# Patient Record
Sex: Female | Born: 1960 | Race: White | Hispanic: No | Marital: Married | Smoking: Never smoker
Health system: Southern US, Community
[De-identification: ages and names within clinical notes are randomized; demographics above are authoritative.]

## PROBLEM LIST (undated history)

## (undated) DIAGNOSIS — E785 Hyperlipidemia, unspecified: Secondary | ICD-10-CM

## (undated) DIAGNOSIS — K8020 Calculus of gallbladder without cholecystitis without obstruction: Secondary | ICD-10-CM

## (undated) DIAGNOSIS — G43909 Migraine, unspecified, not intractable, without status migrainosus: Secondary | ICD-10-CM

## (undated) HISTORY — DX: Hyperlipidemia, unspecified: E78.5

## (undated) HISTORY — PX: BREAST BIOPSY: SHX20

## (undated) HISTORY — PX: ECTOPIC PREGNANCY SURGERY: SHX613

## (undated) HISTORY — DX: Calculus of gallbladder without cholecystitis without obstruction: K80.20

## (undated) HISTORY — DX: Migraine, unspecified, not intractable, without status migrainosus: G43.909

---

## 2004-05-15 ENCOUNTER — Ambulatory Visit: Payer: Self-pay | Admitting: Internal Medicine

## 2005-09-21 ENCOUNTER — Ambulatory Visit: Payer: Self-pay | Admitting: Internal Medicine

## 2005-09-24 ENCOUNTER — Ambulatory Visit: Payer: Self-pay | Admitting: Internal Medicine

## 2008-01-05 ENCOUNTER — Ambulatory Visit: Payer: Self-pay | Admitting: Internal Medicine

## 2009-01-09 ENCOUNTER — Ambulatory Visit: Payer: Self-pay | Admitting: Internal Medicine

## 2010-01-14 ENCOUNTER — Ambulatory Visit: Payer: Self-pay | Admitting: Internal Medicine

## 2011-01-20 ENCOUNTER — Ambulatory Visit: Payer: Self-pay | Admitting: Internal Medicine

## 2012-01-26 ENCOUNTER — Ambulatory Visit: Payer: Self-pay | Admitting: Internal Medicine

## 2012-12-30 ENCOUNTER — Ambulatory Visit: Payer: Self-pay | Admitting: Gastroenterology

## 2013-01-31 ENCOUNTER — Ambulatory Visit: Payer: Self-pay | Admitting: Internal Medicine

## 2014-02-01 ENCOUNTER — Ambulatory Visit: Payer: Self-pay | Admitting: Internal Medicine

## 2014-09-11 ENCOUNTER — Ambulatory Visit
Admit: 2014-09-11 | Disposition: A | Discharge status: Home / Self Care | Payer: Self-pay | Attending: Chiropractic Medicine | Admitting: Chiropractic Medicine

## 2014-09-27 ENCOUNTER — Ambulatory Visit
Admit: 2014-09-27 | Disposition: A | Discharge status: Home / Self Care | Payer: Self-pay | Attending: Internal Medicine | Admitting: Internal Medicine

## 2014-12-27 ENCOUNTER — Other Ambulatory Visit: Payer: Self-pay | Admitting: Internal Medicine

## 2014-12-27 DIAGNOSIS — Z1231 Encounter for screening mammogram for malignant neoplasm of breast: Secondary | ICD-10-CM

## 2015-02-04 ENCOUNTER — Ambulatory Visit
Admission: RE | Admit: 2015-02-04 | Discharge: 2015-02-04 | Disposition: A | Discharge status: Home / Self Care | Payer: PRIVATE HEALTH INSURANCE | Source: Ambulatory Visit | Attending: Internal Medicine | Admitting: Internal Medicine

## 2015-02-04 DIAGNOSIS — Z1231 Encounter for screening mammogram for malignant neoplasm of breast: Secondary | ICD-10-CM | POA: Diagnosis not present

## 2015-03-14 ENCOUNTER — Other Ambulatory Visit: Payer: Self-pay | Admitting: Physician Assistant

## 2015-03-14 ENCOUNTER — Observation Stay
Admission: EM | Admit: 2015-03-14 | Discharge: 2015-03-16 | Disposition: A | Discharge status: Home / Self Care | Payer: PRIVATE HEALTH INSURANCE | Attending: General Surgery | Admitting: General Surgery

## 2015-03-14 ENCOUNTER — Encounter
Admission: EM | Disposition: A | Discharge status: Home / Self Care | Payer: Self-pay | Source: Home / Self Care | Attending: Emergency Medicine

## 2015-03-14 ENCOUNTER — Ambulatory Visit
Admission: RE | Admit: 2015-03-14 | Discharge: 2015-03-14 | Disposition: A | Discharge status: Home / Self Care | Payer: PRIVATE HEALTH INSURANCE | Source: Ambulatory Visit | Attending: Physician Assistant | Admitting: Physician Assistant

## 2015-03-14 ENCOUNTER — Encounter: Payer: Self-pay | Admitting: *Deleted

## 2015-03-14 ENCOUNTER — Ambulatory Visit: Payer: Self-pay | Admitting: Physician Assistant

## 2015-03-14 VITALS — BP 130/80 | HR 97 | Temp 98.3°F

## 2015-03-14 DIAGNOSIS — Z885 Allergy status to narcotic agent status: Secondary | ICD-10-CM | POA: Insufficient documentation

## 2015-03-14 DIAGNOSIS — Z791 Long term (current) use of non-steroidal anti-inflammatories (NSAID): Secondary | ICD-10-CM | POA: Insufficient documentation

## 2015-03-14 DIAGNOSIS — Z9889 Other specified postprocedural states: Secondary | ICD-10-CM | POA: Insufficient documentation

## 2015-03-14 DIAGNOSIS — K5730 Diverticulosis of large intestine without perforation or abscess without bleeding: Secondary | ICD-10-CM | POA: Insufficient documentation

## 2015-03-14 DIAGNOSIS — K8010 Calculus of gallbladder with chronic cholecystitis without obstruction: Secondary | ICD-10-CM

## 2015-03-14 DIAGNOSIS — K819 Cholecystitis, unspecified: Secondary | ICD-10-CM | POA: Diagnosis present

## 2015-03-14 DIAGNOSIS — R109 Unspecified abdominal pain: Secondary | ICD-10-CM

## 2015-03-14 DIAGNOSIS — E785 Hyperlipidemia, unspecified: Secondary | ICD-10-CM | POA: Insufficient documentation

## 2015-03-14 DIAGNOSIS — Z91041 Radiographic dye allergy status: Secondary | ICD-10-CM | POA: Insufficient documentation

## 2015-03-14 DIAGNOSIS — K8012 Calculus of gallbladder with acute and chronic cholecystitis without obstruction: Principal | ICD-10-CM | POA: Insufficient documentation

## 2015-03-14 DIAGNOSIS — K821 Hydrops of gallbladder: Secondary | ICD-10-CM | POA: Insufficient documentation

## 2015-03-14 DIAGNOSIS — Z91013 Allergy to seafood: Secondary | ICD-10-CM | POA: Insufficient documentation

## 2015-03-14 DIAGNOSIS — Z79891 Long term (current) use of opiate analgesic: Secondary | ICD-10-CM | POA: Insufficient documentation

## 2015-03-14 DIAGNOSIS — Z8669 Personal history of other diseases of the nervous system and sense organs: Secondary | ICD-10-CM | POA: Insufficient documentation

## 2015-03-14 HISTORY — PX: LAPAROSCOPIC CHOLECYSTECTOMY SINGLE PORT: SHX5891

## 2015-03-14 LAB — COMPREHENSIVE METABOLIC PANEL
ALK PHOS: 116 U/L (ref 38–126)
ALT: 78 U/L — AB (ref 14–54)
AST: 56 U/L — AB (ref 15–41)
Albumin: 4.4 g/dL (ref 3.5–5.0)
Anion gap: 10 (ref 5–15)
BILIRUBIN TOTAL: 0.6 mg/dL (ref 0.3–1.2)
BUN: 13 mg/dL (ref 6–20)
CALCIUM: 9.8 mg/dL (ref 8.9–10.3)
CHLORIDE: 102 mmol/L (ref 101–111)
CO2: 25 mmol/L (ref 22–32)
CREATININE: 0.67 mg/dL (ref 0.44–1.00)
Glucose, Bld: 120 mg/dL — ABNORMAL HIGH (ref 65–99)
Potassium: 3.9 mmol/L (ref 3.5–5.1)
Sodium: 137 mmol/L (ref 135–145)
TOTAL PROTEIN: 8.1 g/dL (ref 6.5–8.1)

## 2015-03-14 LAB — CBC WITH DIFFERENTIAL/PLATELET
Basophils Absolute: 0.1 10*3/uL (ref 0–0.1)
Basophils Relative: 0 %
EOS PCT: 0 %
Eosinophils Absolute: 0 10*3/uL (ref 0–0.7)
HEMATOCRIT: 39.5 % (ref 35.0–47.0)
Hemoglobin: 13.2 g/dL (ref 12.0–16.0)
LYMPHS ABS: 1.9 10*3/uL (ref 1.0–3.6)
LYMPHS PCT: 10 %
MCH: 30.4 pg (ref 26.0–34.0)
MCHC: 33.5 g/dL (ref 32.0–36.0)
MCV: 90.8 fL (ref 80.0–100.0)
Monocytes Absolute: 0.9 10*3/uL (ref 0.2–0.9)
Monocytes Relative: 5 %
NEUTROS ABS: 16.1 10*3/uL — AB (ref 1.4–6.5)
Neutrophils Relative %: 85 %
PLATELETS: 301 10*3/uL (ref 150–440)
RBC: 4.35 MIL/uL (ref 3.80–5.20)
RDW: 13.2 % (ref 11.5–14.5)
WBC: 19.1 10*3/uL — AB (ref 3.6–11.0)

## 2015-03-14 LAB — URINALYSIS COMPLETE WITH MICROSCOPIC (ARMC ONLY)
BILIRUBIN URINE: NEGATIVE
Bacteria, UA: NONE SEEN
GLUCOSE, UA: NEGATIVE mg/dL
Hgb urine dipstick: NEGATIVE
KETONES UR: NEGATIVE mg/dL
Nitrite: NEGATIVE
PH: 6 (ref 5.0–8.0)
Protein, ur: NEGATIVE mg/dL
Specific Gravity, Urine: 1.015 (ref 1.005–1.030)

## 2015-03-14 LAB — POCT URINALYSIS DIPSTICK
BILIRUBIN UA: NEGATIVE
GLUCOSE UA: NEGATIVE
Ketones, UA: NEGATIVE
NITRITE UA: NEGATIVE
PH UA: 7
Protein, UA: NEGATIVE
Spec Grav, UA: 1.02
Urobilinogen, UA: 0.2

## 2015-03-14 LAB — LIPASE, BLOOD: LIPASE: 30 U/L (ref 22–51)

## 2015-03-14 SURGERY — LAPAROSCOPIC CHOLECYSTECTOMY SINGLE SITE
Anesthesia: General

## 2015-03-14 MED ORDER — KETOROLAC TROMETHAMINE 60 MG/2ML IM SOLN: 60.0000 mg | Freq: Once | INTRAMUSCULAR | Status: DC

## 2015-03-14 MED ORDER — KETOROLAC TROMETHAMINE 10 MG PO TABS: 10.0000 mg | ORAL_TABLET | Freq: Four times a day (QID) | ORAL | Status: DC | PRN

## 2015-03-14 MED ORDER — DEXTROSE 5 % IV SOLN
2.0000 g | Freq: Once | INTRAVENOUS | Status: AC
  Administered 2015-03-14: 2 g via INTRAVENOUS
  Filled 2015-03-14: qty 2

## 2015-03-14 MED ORDER — SODIUM CHLORIDE 0.9 % IV BOLUS (SEPSIS)
1000.0000 mL | Freq: Once | INTRAVENOUS | Status: AC
  Administered 2015-03-14: 1000 mL via INTRAVENOUS

## 2015-03-14 SURGICAL SUPPLY — 66 items
ADH LQ OCL WTPRF AMP STRL LF (MISCELLANEOUS) ×1
ADHESIVE MASTISOL STRL (MISCELLANEOUS) ×2 IMPLANT
APPLIER CLIP ROT 10 11.4 M/L (STAPLE) ×3
APR CLP MED LRG 11.4X10 (STAPLE) ×1
BAG COUNTER SPONGE EZ (MISCELLANEOUS) ×1 IMPLANT
BAG SPNG 4X4 CLR HAZ (MISCELLANEOUS)
BLADE SURG SZ11 CARB STEEL (BLADE) ×3 IMPLANT
BULB RESERV EVAC DRAIN JP 100C (MISCELLANEOUS) ×1 IMPLANT
CANISTER SUCT 1200ML W/VALVE (MISCELLANEOUS) ×5 IMPLANT
CATH REDDICK CHOLANGI 4FR 50CM (CATHETERS) IMPLANT
CHLORAPREP W/TINT 26ML (MISCELLANEOUS) ×3 IMPLANT
CLIP APPLIE ROT 10 11.4 M/L (STAPLE) ×1 IMPLANT
CLOSURE WOUND 1/2 X4 (GAUZE/BANDAGES/DRESSINGS) ×1
CONRAY 60ML FOR OR (MISCELLANEOUS) IMPLANT
COUNTER SPONGE BAG EZ (MISCELLANEOUS)
DISSECTOR KITTNER STICK (MISCELLANEOUS) ×1 IMPLANT
DISSECTORS/KITTNER STICK (MISCELLANEOUS)
DRAIN CHANNEL JP 19F (MISCELLANEOUS) ×1 IMPLANT
DRAPE SHEET LG 3/4 BI-LAMINATE (DRAPES) ×3 IMPLANT
DRESSING SURGICEL FIBRLLR 1X2 (HEMOSTASIS) IMPLANT
DRSG SURGICEL FIBRILLAR 1X2 (HEMOSTASIS) ×3
DRSG TEGADERM 2-3/8X2-3/4 SM (GAUZE/BANDAGES/DRESSINGS) ×4 IMPLANT
DRSG TELFA 3X8 NADH (GAUZE/BANDAGES/DRESSINGS) IMPLANT
ELECT E-Z MONOPOLAR 33 (MISCELLANEOUS) ×3
ELECTRODE E-Z MONOPOLAR 33 (MISCELLANEOUS) IMPLANT
ENDOLOOP SUT PDS II  0 18 (SUTURE)
ENDOLOOP SUT PDS II 0 18 (SUTURE) ×1 IMPLANT
ENDOPOUCH RETRIEVER 10 (MISCELLANEOUS) ×3 IMPLANT
GAUZE SPONGE 4X4 12PLY STRL (GAUZE/BANDAGES/DRESSINGS) ×2 IMPLANT
GLOVE BIO SURGEON STRL SZ7.5 (GLOVE) ×9 IMPLANT
GOWN STRL REUS W/ TWL LRG LVL3 (GOWN DISPOSABLE) ×3 IMPLANT
GOWN STRL REUS W/TWL LRG LVL3 (GOWN DISPOSABLE) ×6
HEMOSTAT SURGICEL 2X3 (HEMOSTASIS) ×6 IMPLANT
IRRIGATION STRYKERFLOW (MISCELLANEOUS) ×1 IMPLANT
IRRIGATOR STRYKERFLOW (MISCELLANEOUS) ×3
IV CATH ANGIO 12GX3 LT BLUE (NEEDLE) IMPLANT
IV NS 1000ML (IV SOLUTION) ×3
IV NS 1000ML BAXH (IV SOLUTION) ×1 IMPLANT
LABEL OR SOLS (LABEL) ×1 IMPLANT
LIQUID BAND (GAUZE/BANDAGES/DRESSINGS) IMPLANT
NDL HYPO 25X1 1.5 SAFETY (NEEDLE) ×1 IMPLANT
NEEDLE HYPO 25X1 1.5 SAFETY (NEEDLE) ×3 IMPLANT
NS IRRIG 500ML POUR BTL (IV SOLUTION) ×3 IMPLANT
PACK LAP CHOLECYSTECTOMY (MISCELLANEOUS) ×3 IMPLANT
PAD DRESSING TELFA 3X8 NADH (GAUZE/BANDAGES/DRESSINGS) ×1 IMPLANT
PAD GROUND ADULT SPLIT (MISCELLANEOUS) ×3 IMPLANT
PENCIL ELECTRO HAND CTR (MISCELLANEOUS) ×2 IMPLANT
SCISSORS METZENBAUM CVD 33 (INSTRUMENTS) ×3 IMPLANT
SEAL FOR SCOPE WARMER C3101 (MISCELLANEOUS) ×1 IMPLANT
SLEEVE ENDOPATH XCEL 5M (ENDOMECHANICALS) ×5 IMPLANT
SLEEVE SCD COMPRESS THIGH MED (MISCELLANEOUS) ×2 IMPLANT
STRAP SAFETY BODY (MISCELLANEOUS) ×3 IMPLANT
STRIP CLOSURE SKIN 1/2X4 (GAUZE/BANDAGES/DRESSINGS) ×2 IMPLANT
SUT MNCRL 4-0 (SUTURE) ×6
SUT MNCRL 4-0 27XMFL (SUTURE) ×2
SUT VIC AB 2-0 SH 27 (SUTURE) ×3
SUT VIC AB 2-0 SH 27XBRD (SUTURE) IMPLANT
SUT VICRYL 0 AB UR-6 (SUTURE) ×1 IMPLANT
SUTURE MNCRL 4-0 27XMF (SUTURE) ×1 IMPLANT
SWABSTK COMLB BENZOIN TINCTURE (MISCELLANEOUS) IMPLANT
TROCAR XCEL 12X100 BLDLESS (ENDOMECHANICALS) ×2 IMPLANT
TROCAR XCEL BLUNT TIP 100MML (ENDOMECHANICALS) ×1 IMPLANT
TROCAR XCEL NON-BLD 11X100MML (ENDOMECHANICALS) ×1 IMPLANT
TROCAR XCEL NON-BLD 5MMX100MML (ENDOMECHANICALS) ×3 IMPLANT
TUBING INSUFFLATOR HI FLOW (MISCELLANEOUS) ×3 IMPLANT
WATER STERILE IRR 1000ML POUR (IV SOLUTION) ×1 IMPLANT

## 2015-03-14 NOTE — Addendum Note (Signed)
Addended by: Faythe Ghee on: 03/14/2015 05:08 PM   Modules accepted: Orders

## 2015-03-14 NOTE — H&P (Signed)
Patient ID: Jasmine [name], female   DOB: 11-16-1960, 54 y.o.   MRN: [id number]  CC: RUQ PAIN  HPI Jasmine [name] is a 54 y.o. female presents to emergency department for evaluation of a 5 day history of back pain and right upper quadrant pain. Patient states that 5 days ago she started having back pain in her right upper back that she thought was her usual back pain from her chronic problems. She states that gradually over the week she's had the pain moved to her right upper quadrant of her abdomen that she is from her front or back. The pain is made worse after eating especially fatty foods. However her abdominal pain acutely got worse today after eating [place] food for lunch. She denies any fevers, chills, nausea, vomiting, diarrhea, chest pain, shortness of breath. She's never had anything like this before in her abdomen. She was in her usual state of good health prior to the starting.  HPI  History reviewed. No pertinent past medical history.  Past Surgical History  Procedure Laterality Date  . Breast biopsy Left     neg    History reviewed. No pertinent family history.  Social History Social History  Substance Use Topics  . Smoking status: Never Smoker   . Smokeless tobacco: None  . Alcohol Use: No    Allergies  Allergen Reactions  . Codeine Nausea Only    Family history of allergy.  . Iodinated Diagnostic Agents Itching  . Shellfish Allergy Itching    Current Facility-Administered Medications  Medication Dose Route Frequency Provider Last Rate Last Dose  . cefOXitin (MEFOXIN) 2 g in dextrose 5 % 50 mL IVPB  2 g Intravenous Once Rockne Menghini, MD       Current Outpatient Prescriptions  Medication Sig Dispense Refill  . ibuprofen (ADVIL,MOTRIN) 200 MG tablet Take 400 mg by mouth every 6 (six) hours as needed for mild pain or moderate pain.     Marland Kitchen ketorolac (TORADOL) 10 MG tablet Take 1 tablet (10 mg total) by mouth every 6 (six) hours as needed. (Patient not  taking: Reported on 03/14/2015) 20 tablet 0     Review of Systems A multipoint review of systems was completed. All pertinent positives and negatives within the history of present illness the remainder were negative.  Physical Exam Blood pressure 160/95, pulse 99, temperature 99 F (37.2 C), temperature source Oral, resp. rate 18, height  (1.549 m), weight 78.019 kg (172 lb), SpO2 95 %. CONSTITUTIONAL: Resting in bed in no acute distress.Marland Kitchen EYES: Pupils are equal, round, and reactive to light, Sclera are non-icteric. EARS, NOSE, MOUTH AND THROAT: The oropharynx is clear. The oral mucosa is pink and moist. Hearing is intact to voice. LYMPH NODES:  Lymph nodes in the neck are normal. RESPIRATORY:  Lungs are clear. There is normal respiratory effort, with equal breath sounds bilaterally, and without pathologic use of accessory muscles. CARDIOVASCULAR: Heart is regular without murmurs, gallops, or rubs. GI: The abdomen is soft, tender to palpation in the right upper quadrant, and nondistended. There is no Murphy sign on my exam. No peritoneal signs There are no palpable masses. There is no hepatosplenomegaly. There are normal bowel sounds in all quadrants. GU: Rectal deferred.   MUSCULOSKELETAL: Normal muscle strength and tone. No cyanosis or edema.   SKIN: Turgor is good and there are no pathologic skin lesions or ulcers. NEUROLOGIC: Motor and sensation is grossly normal. Cranial nerves are grossly intact. PSYCH:  Oriented to person,  place and time. Affect is normal.  Data Reviewed Labs and imaging reviewed. Labs concerning for white blood cell count 19.1. No laboratory findings concerning for choledocholithiasis. CT scan reviewed. Shows a dilated gallbladder with a 2 cm stone in the gallbladder fossa with some mild pericholecystic stranding consistent with cholecystitis. I have personally reviewed the patient's imaging, laboratory findings and medical records.    Assessment   73  54 year old female with cholecystitis     Plan    Patient has were counseled as to the diagnosis of cholecystitis. Treatment options of antibiotics with a delayed cholecystectomy versus immediate laparoscopic cholecystectomy were discussed in detail. The procedure was ascribed in detail to include the risks, benefits, alternatives. Patient and her husband both voiced understanding and wished proceed and informed consent was obtained by the emergency department. We'll proceed to the operating room emergently for laparoscopic cholecystectomy. We'll admit for observation postoperatively.      Time spent with the patient was 30 minutes, with more than 50% of the time spent in face-to-face education, counseling and care coordination.     Ricarda Frame 03/14/2015, 11:09 PM

## 2015-03-14 NOTE — ED Notes (Signed)
Pt has right upper quad abd pain for 5 days.  Pt had outpt ct scan today.  Sent to er for eval of abd pain.  No n/v/d.

## 2015-03-14 NOTE — ED Provider Notes (Signed)
[facility] Emergency Department Provider Note  ____________________________________________  Time seen: Approximately 9:18 PM  I have reviewed the triage vital signs and the nursing notes.   HISTORY  Chief Complaint Abdominal Pain    HPI Jasmine [name] is a 54 y.o. female presenting for outpatient CT scan showing cholecystitis. Patient reports that for the past 5 days she has had right mid back pain that radiates to the right upper quadrant. Today she began to have a "gripping" right upper quadrant pain. She went to employee health who ordered labs including a white blood cell count of 19 and a CT scan of the abdomen showing cholecystitis. The patient denies any nausea, vomiting, fever, chills, urinary symptoms, constipation or diarrhea.   No past medical history on file.  Patient Active Problem List   Diagnosis Date Noted  . History of migraine headaches 03/14/2015  . HLD (hyperlipidemia) 03/14/2015    Past Surgical History  Procedure Laterality Date  . Breast biopsy Left     neg    Current Outpatient Rx  Name  Route  Sig  Dispense  Refill  . Cholecalciferol (VITAMIN D-1000 MAX ST) 1000 UNITS tablet   Oral   Take by mouth.         Marland Kitchen ibuprofen (ADVIL,MOTRIN) 200 MG tablet   Oral   Take by mouth.         Marland Kitchen ketorolac (TORADOL) 10 MG tablet   Oral   Take 1 tablet (10 mg total) by mouth every 6 (six) hours as needed.   20 tablet   0     Allergies Codeine; Iodinated diagnostic agents; and Shellfish allergy  No family history on file.  Social History Social History  Substance Use Topics  . Smoking status: Never Smoker   . Smokeless tobacco: Not on file  . Alcohol Use: No    Review of Systems Constitutional: No fever/chills. No syncope Eyes: No visual changes. ENT: No sore throat. Cardiovascular: Denies chest pain, palpitations. Respiratory: Denies shortness of breath.  No cough. Gastrointestinal: Positive abdominal pain.   No nausea, no vomiting.  No diarrhea.  No constipation. Genitourinary: Negative for dysuria. Musculoskeletal: Negative for back pain. Skin: Negative for rash. Neurological: Negative for headaches, focal weakness or numbness.  10-point ROS otherwise negative.  ____________________________________________   PHYSICAL EXAM:  VITAL SIGNS: ED Triage Vitals  Enc Vitals Group     BP 03/14/15 1803 163/89 mmHg     Pulse Rate 03/14/15 1803 112     Resp 03/14/15 1803 20     Temp 03/14/15 1803 99 F (37.2 C)     Temp Source 03/14/15 1803 Oral     SpO2 03/14/15 1803 99 %     Weight 03/14/15 1803 172 lb (78.019 kg)     Height 03/14/15 1803  (1.549 m)     Head Cir --      Peak Flow --      Pain Score 03/14/15 1804 6     Pain Loc --      Pain Edu? --      Excl. in GC? --     Constitutional: Alert and oriented. Well appearing and in no acute distress. Answer question appropriately. Eyes: Conjunctivae are normal.  EOMI. Head: Atraumatic. Nose: No congestion/rhinnorhea. Mouth/Throat: Mucous membranes are moist.  Neck: No stridor.  Supple.   Cardiovascular: Normal rate, regular rhythm. No murmurs, rubs or gallops.  Respiratory: Normal respiratory effort.  No retractions. Lungs CTAB.  No wheezes, rales  or ronchi. Gastrointestinal: Soft tender to palpation in the right upper quadrant with positive Murphy sign. No peritoneal signs. No distention. No peritoneal signs. Musculoskeletal: No LE edema.  Neurologic:  Normal speech and language. No gross focal neurologic deficits are appreciated.  Skin:  Skin is warm, dry and intact. No rash noted. Psychiatric: Mood and affect are normal. Speech and behavior are normal.  Normal judgement.  ____________________________________________   LABS (all labs ordered are listed, but only abnormal results are displayed)  Labs Reviewed  CBC WITH DIFFERENTIAL/PLATELET - Abnormal; Notable for the following:    WBC 19.1 (*)    Neutro Abs 16.1 (*)     All other components within normal limits  COMPREHENSIVE METABOLIC PANEL - Abnormal; Notable for the following:    Glucose, Bld 120 (*)    AST 56 (*)    ALT 78 (*)    All other components within normal limits  URINALYSIS COMPLETEWITH MICROSCOPIC ([facility] ONLY) - Abnormal; Notable for the following:    Color, Urine YELLOW (*)    APPearance CLEAR (*)    Leukocytes, UA 2+ (*)    Squamous Epithelial / LPF 0-5 (*)    All other components within normal limits  LIPASE, BLOOD   ____________________________________________  EKG  Not indicated ____________________________________________  RADIOLOGY  Ct Renal Stone Study  03/14/2015   CLINICAL DATA:  Acute right flank pain.  EXAM: CT ABDOMEN AND PELVIS WITHOUT CONTRAST  TECHNIQUE: Multidetector CT imaging of the abdomen and pelvis was performed following the standard protocol without IV contrast.  COMPARISON:  None.  FINDINGS: Lower chest:  Lung bases are clear.  Hepatobiliary: 2.4 cm calcified gallstone located at the gallbladder neck. Mild distention of the gallbladder. Subtle stranding near the gallbladder fundus. Normal appearance of the liver.  Pancreas: Normal.  Spleen: Normal.  Adrenals/Urinary Tract: Normal appearance of the adrenal glands. Negative for kidney stones or hydronephrosis. Negative for ureter or bladder stones.  Stomach/Bowel: Normal appearance of the stomach and duodenum. Normal appearance of small bowel without dilatation. Normal appearance of the appendix and colon. Diverticula involving the descending colon without acute inflammatory changes.  Vascular/Lymphatic: No evidence for lymphadenopathy. Incidentally, there is a circumaortic left renal vein. Normal caliber of the abdominal aorta.  Reproductive: No gross abnormality to the uterus or adnexal tissue.  Other: No free air.  No free fluid.  Musculoskeletal: No acute bone abnormality.  IMPRESSION: 2.4 cm calcified gallstone at the gallbladder neck with mild gallbladder distension  and pericholecystic stranding. Findings are most compatible with acute cholecystitis. These findings may be better characterized with ultrasound.  Negative for kidney stones or hydronephrosis.   Electronically Signed   By: Richarda Overlie M.D.   On: 03/14/2015 17:11    ____________________________________________   PROCEDURES  Procedure(s) performed: None  Critical Care performed: No ____________________________________________   INITIAL IMPRESSION / ASSESSMENT AND PLAN / ED COURSE  Pertinent labs & imaging results that were available during my care of the patient were reviewed by me and considered in my medical decision making (see chart for details).  54 y.o. female with clinical history, labs and CT scan consistent with cholecystitis. The patient has been nothing by mouth since noon. Gen. surgery has been called.  __________________ ----------------------------------------- 9:41 PM on 03/14/2015 -----------------------------------------  I have spoken with Dr. Tonita Cong, who will come see the patient when he has completed his or case. At this time the patient is stable and continuing to be able to tolerate her pain without any pain medications.  __________________________  FINAL CLINICAL IMPRESSION(S) / ED DIAGNOSES  Final diagnoses:  Cholecystitis      NEW MEDICATIONS STARTED DURING THIS VISIT:  New Prescriptions   No medications on file     Rockne Menghini, MD 03/14/15 2143

## 2015-03-14 NOTE — Progress Notes (Signed)
Patient ID: Jasmine [name], female   DOB: 1961/03/19, 54 y.o.   MRN: [id number] S: c/o r sided flank pain, no n/v, pain is shooting stabbing type pain, comes in waves, started on Saturday, was a dull pain, then saw chiropractor and was a little better , now pain is much worse, ate [place] food for lunch, ?if its her gallbladder , no urinary sx, no cp/sob, remainder ros neg  O: vitals wnl, nad, abd soft a little tender in ruq, bs normal, ua + trace leuks and blood,  A: acute flank pain with hematuria  P: ct abd/pelvis stat, pt did not want toradol prior to ct,

## 2015-03-15 ENCOUNTER — Telehealth: Payer: Self-pay | Admitting: Physician Assistant

## 2015-03-15 ENCOUNTER — Encounter: Payer: Self-pay | Admitting: Anesthesiology

## 2015-03-15 ENCOUNTER — Observation Stay: Payer: PRIVATE HEALTH INSURANCE | Admitting: Certified Registered Nurse Anesthetist

## 2015-03-15 DIAGNOSIS — K8010 Calculus of gallbladder with chronic cholecystitis without obstruction: Secondary | ICD-10-CM

## 2015-03-15 LAB — HEMOGLOBIN AND HEMATOCRIT, BLOOD
HCT: 33.5 % — ABNORMAL LOW (ref 35.0–47.0)
HEMOGLOBIN: 11 g/dL — AB (ref 12.0–16.0)

## 2015-03-15 LAB — CBC
HCT: 34.2 % — ABNORMAL LOW (ref 35.0–47.0)
Hemoglobin: 11.3 g/dL — ABNORMAL LOW (ref 12.0–16.0)
MCH: 29.7 pg (ref 26.0–34.0)
MCHC: 33 g/dL (ref 32.0–36.0)
MCV: 90.1 fL (ref 80.0–100.0)
PLATELETS: 267 10*3/uL (ref 150–440)
RBC: 3.79 MIL/uL — AB (ref 3.80–5.20)
RDW: 13.2 % (ref 11.5–14.5)
WBC: 16.6 10*3/uL — AB (ref 3.6–11.0)

## 2015-03-15 MED ORDER — MORPHINE SULFATE (PF) 4 MG/ML IV SOLN: 4.0000 mg | INTRAVENOUS | Status: DC | PRN

## 2015-03-15 MED ORDER — ONDANSETRON HCL 4 MG/2ML IJ SOLN
INTRAMUSCULAR | Status: DC | PRN
  Administered 2015-03-15: 4 mg via INTRAVENOUS

## 2015-03-15 MED ORDER — FENTANYL CITRATE (PF) 100 MCG/2ML IJ SOLN: 25.0000 ug | INTRAMUSCULAR | Status: DC | PRN

## 2015-03-15 MED ORDER — ACETAMINOPHEN 325 MG PO TABS
650.0000 mg | ORAL_TABLET | Freq: Four times a day (QID) | ORAL | Status: DC | PRN
  Administered 2015-03-15 – 2015-03-16 (×3): 650 mg via ORAL
  Filled 2015-03-15 (×3): qty 2

## 2015-03-15 MED ORDER — ROCURONIUM BROMIDE 100 MG/10ML IV SOLN
INTRAVENOUS | Status: DC | PRN
  Administered 2015-03-15: 8 mg via INTRAVENOUS
  Administered 2015-03-15: 5 mg via INTRAVENOUS
  Administered 2015-03-15: 35 mg via INTRAVENOUS
  Administered 2015-03-15: 7 mg via INTRAVENOUS

## 2015-03-15 MED ORDER — MIDAZOLAM HCL 2 MG/2ML IJ SOLN
INTRAMUSCULAR | Status: DC | PRN
  Administered 2015-03-15: 1 mg via INTRAVENOUS

## 2015-03-15 MED ORDER — DIPHENHYDRAMINE HCL 50 MG/ML IJ SOLN: 25.0000 mg | Freq: Four times a day (QID) | INTRAMUSCULAR | Status: DC | PRN

## 2015-03-15 MED ORDER — HYDRALAZINE HCL 20 MG/ML IJ SOLN: 10.0000 mg | INTRAMUSCULAR | Status: DC | PRN

## 2015-03-15 MED ORDER — LIDOCAINE HCL (CARDIAC) 20 MG/ML IV SOLN
INTRAVENOUS | Status: DC | PRN
  Administered 2015-03-15: 100 mg via INTRAVENOUS

## 2015-03-15 MED ORDER — FENTANYL CITRATE (PF) 100 MCG/2ML IJ SOLN
INTRAMUSCULAR | Status: DC | PRN
  Administered 2015-03-15 (×2): 50 ug via INTRAVENOUS
  Administered 2015-03-15: 100 ug via INTRAVENOUS
  Administered 2015-03-15: 150 ug via INTRAVENOUS

## 2015-03-15 MED ORDER — PHENYLEPHRINE HCL 10 MG/ML IJ SOLN
INTRAMUSCULAR | Status: DC | PRN
  Administered 2015-03-15: 200 ug via INTRAVENOUS
  Administered 2015-03-15 (×2): 100 ug via INTRAVENOUS
  Administered 2015-03-15: 200 ug via INTRAVENOUS

## 2015-03-15 MED ORDER — DIPHENHYDRAMINE HCL 25 MG PO CAPS: 25.0000 mg | ORAL_CAPSULE | Freq: Four times a day (QID) | ORAL | Status: DC | PRN

## 2015-03-15 MED ORDER — GLYCOPYRROLATE 0.2 MG/ML IJ SOLN
INTRAMUSCULAR | Status: DC | PRN
  Administered 2015-03-15: .5 mg via INTRAVENOUS

## 2015-03-15 MED ORDER — PROMETHAZINE HCL 25 MG/ML IJ SOLN
6.2500 mg | INTRAMUSCULAR | Status: DC | PRN
Start: 2015-03-15 — End: 2015-03-15

## 2015-03-15 MED ORDER — PROPOFOL 10 MG/ML IV BOLUS
INTRAVENOUS | Status: DC | PRN
  Administered 2015-03-15: 150 mg via INTRAVENOUS

## 2015-03-15 MED ORDER — LACTATED RINGERS IV SOLN
INTRAVENOUS | Status: DC
  Administered 2015-03-15 – 2015-03-16 (×4): via INTRAVENOUS

## 2015-03-15 MED ORDER — HYDROCODONE-ACETAMINOPHEN 5-325 MG PO TABS: 1.0000 | ORAL_TABLET | ORAL | Status: DC | PRN

## 2015-03-15 MED ORDER — LACTATED RINGERS IV SOLN
INTRAVENOUS | Status: DC | PRN
  Administered 2015-03-15 (×2): via INTRAVENOUS

## 2015-03-15 MED ORDER — LIDOCAINE HCL 1 % IJ SOLN
INTRAMUSCULAR | Status: DC | PRN
  Administered 2015-03-15 (×2): 10 mL via INTRAMUSCULAR

## 2015-03-15 MED ORDER — ONDANSETRON 4 MG PO TBDP: 4.0000 mg | ORAL_TABLET | Freq: Four times a day (QID) | ORAL | Status: DC | PRN

## 2015-03-15 MED ORDER — ONDANSETRON HCL 4 MG/2ML IJ SOLN: 4.0000 mg | Freq: Four times a day (QID) | INTRAMUSCULAR | Status: DC | PRN

## 2015-03-15 MED ORDER — NEOSTIGMINE METHYLSULFATE 10 MG/10ML IV SOLN
INTRAVENOUS | Status: DC | PRN
  Administered 2015-03-15: 3 mg via INTRAVENOUS

## 2015-03-15 MED ORDER — DEXAMETHASONE SODIUM PHOSPHATE 4 MG/ML IJ SOLN
INTRAMUSCULAR | Status: DC | PRN
  Administered 2015-03-15: 5 mg via INTRAVENOUS

## 2015-03-15 NOTE — Brief Op Note (Signed)
03/14/2015 - 03/15/2015  3:23 AM  PATIENT:  Jasmine [name]  54 y.o. female  PRE-OPERATIVE DIAGNOSIS:  cholecystitis  POST-OPERATIVE DIAGNOSIS:  Cholecystitis with gallbladder hydrops  PROCEDURE:  Procedure(s): LAPAROSCOPIC CHOLECYSTECTOMY SINGLE SITE (N/A)  SURGEON:  Surgeon(s) and Role:    * Ricarda Frame, MD - Primary  PHYSICIAN ASSISTANT:   ASSISTANTS: none   ANESTHESIA:   general  EBL:  Total I/O In: 1750 [I.V.:1750] Out: 500 [Blood:500]  BLOOD ADMINISTERED:none  DRAINS: none   LOCAL MEDICATIONS USED:  MARCAINE 0.5%  , XYLOCAINE 1% and Amount: 20 ml  SPECIMEN:  Source of Specimen:  gallbladder  DISPOSITION OF SPECIMEN:  PATHOLOGY  COUNTS:  YES  TOURNIQUET:  * No tourniquets in log *  DICTATION: .Dragon Dictation  PLAN OF CARE: Admit for overnight observation  PATIENT DISPOSITION:  PACU - hemodynamically stable.   Delay start of Pharmacological VTE agent (>24hrs) due to surgical blood loss or risk of bleeding: yes

## 2015-03-15 NOTE — Anesthesia Preprocedure Evaluation (Signed)
Anesthesia Evaluation  Patient identified by MRN, date of birth, ID band Patient awake    Reviewed: Allergy & Precautions, H&P , NPO status , Patient's Chart, lab work & pertinent test results, reviewed documented beta blocker date and time   History of Anesthesia Complications Negative for: history of anesthetic complications  Airway Mallampati: III  TM Distance: >3 FB Neck ROM: full    Dental  (+) Caps, Teeth Intact   Pulmonary neg pulmonary ROS,    Pulmonary exam normal breath sounds clear to auscultation       Cardiovascular Exercise Tolerance: Good negative cardio ROS Normal cardiovascular exam Rhythm:regular Rate:Normal     Neuro/Psych neg Seizures Bulging cervical discs, tingling in right fingers negative psych ROS   GI/Hepatic negative GI ROS, Neg liver ROS,   Endo/Other  negative endocrine ROS  Renal/GU negative Renal ROS  negative genitourinary   Musculoskeletal   Abdominal   Peds  Hematology negative hematology ROS (+)   Anesthesia Other Findings History reviewed. No pertinent past medical history.   Reproductive/Obstetrics negative OB ROS                             Anesthesia Physical Anesthesia Plan  ASA: II  Anesthesia Plan: General   Post-op Pain Management:    Induction:   Airway Management Planned:   Additional Equipment:   Intra-op Plan:   Post-operative Plan:   Informed Consent: I have reviewed the patients History and Physical, chart, labs and discussed the procedure including the risks, benefits and alternatives for the proposed anesthesia with the patient or authorized representative who has indicated his/her understanding and acceptance.   Dental Advisory Given  Plan Discussed with: Anesthesiologist, CRNA and Surgeon  Anesthesia Plan Comments:         Anesthesia Quick Evaluation

## 2015-03-15 NOTE — Anesthesia Postprocedure Evaluation (Signed)
  Anesthesia Post-op Note  Patient: Jasmine [name]  Procedure(s) Performed: Procedure(s): LAPAROSCOPIC CHOLECYSTECTOMY SINGLE SITE (N/A)  Anesthesia type:General  Patient location: PACU  Post pain: Pain level controlled  Post assessment: Post-op Vital signs reviewed, Patient's Cardiovascular Status Stable, Respiratory Function Stable, Patent Airway and No signs of Nausea or vomiting  Post vital signs: Reviewed and stable  Last Vitals:  Filed Vitals:   03/15/15 0430  BP: 116/64  Pulse: 83  Temp: 36.5 C  Resp: 18    Level of consciousness: awake, alert  and patient cooperative  Complications: No apparent anesthesia complications

## 2015-03-15 NOTE — Progress Notes (Signed)
Surgery progress note  S: Min pain.  H/H at 11 O:Blood pressure 116/64, pulse 83, temperature 97.7 F (36.5 C), temperature source Oral, resp. rate 18, height  (1.549 m), weight 172 lb (78.019 kg), SpO2 96 %. GEN: NAD/A&Ox3 ABD; soft, min tender, dressing c/d/i  A/P 54 yo s/p lap chole, doing well - serial H/H - npo for now

## 2015-03-15 NOTE — Anesthesia Procedure Notes (Signed)
Procedure Name: Intubation Date/Time: 03/15/2015 12:23 AM Performed by: Shirlee Limerick, Joesiah Lonon Pre-anesthesia Checklist: Patient identified, Emergency Drugs available, Suction available and Patient being monitored Oxygen Delivery Method: Circle system utilized Preoxygenation: Pre-oxygenation with 100% oxygen Intubation Type: IV induction Laryngoscope Size: Mac and 3 Grade View: Grade I Tube size: 7.0 mm Number of attempts: 1 Placement Confirmation: ETT inserted through vocal cords under direct vision,  positive ETCO2 and breath sounds checked- equal and bilateral Secured at: 21 cm Tube secured with: Tape Dental Injury: Teeth and Oropharynx as per pre-operative assessment

## 2015-03-15 NOTE — Op Note (Signed)
Laparoscopic Cholecystectomy  Pre-operative Diagnosis: Acute cholecystitis  Post-operative Diagnosis: Acute cholecystitis with gallbladder hydrops  Procedure: Laparoscopic cholecystectomy  Surgeon: Leonette Most T. Tonita Cong, MD FACS  Anesthesia: Gen. with endotracheal tube  Assistant: None  Procedure Details  The patient was seen again in the Holding Room. The benefits, complications, treatment options, and expected outcomes were discussed with the patient. The risks of bleeding, infection, recurrence of symptoms, failure to resolve symptoms, bile duct damage, bile duct leak, retained common bile duct stone, bowel injury, any of which could require further surgery and/or ERCP, stent, or papillotomy were reviewed with the patient. The likelihood of improving the patient's symptoms with return to their baseline status is good.  The patient and/or family concurred with the proposed plan, giving informed consent.  The patient was taken to Operating Room, identified as Jasmine [name] and the procedure verified as Laparoscopic Cholecystectomy.  A Time Out was held and the above information confirmed.  Prior to the induction of general anesthesia, antibiotic prophylaxis was administered. VTE prophylaxis was in place. General endotracheal anesthesia was then administered and tolerated well. After the induction, the abdomen was prepped with Chloraprep and draped in the sterile fashion. The patient was positioned in the supine position.  Local anesthetic  was injected into the skin in the right upper quadrant in the mid clavicular line and an incision made. The Veress needle was placed. Pneumoperitoneum was then created with CO2 and tolerated well without any adverse changes in the patient's vital signs. A 5mm port was placed in the aforementioned position and the abdominal cavity was explored.  A 5 mm port was placed in the umbilical location under direct visualization followed by an additional 5 mm trocar in  the right upper quadrant and a 12 mm epigastric port was placed all under direct vision. All skin incisions  were infiltrated with a local anesthetic agent before making the incision and placing the trocars.   The patient was positioned  in reverse Trendelenburg, tilted slightly to the patient's left.  The gallbladder was identified, the fundus was unable to be grasped due to the tenseness of the gallbladder. The gallbladder was then penetrated with the laparoscopic needle and decompressed. This revealed a clear fluid consistent with gallbladder hydrops. Once the gallbladder was fully decompressed it was grasped and retracted cephalad. There was a thick rind of inflammation with numerous adhesions. Adhesions were lysed bluntly. The infundibulum was grasped and retracted laterally, after extensive dissection I eventually was able to expose the peritoneum overlying the triangle of Calot. The cystic duct was adhered to the infundibulum of the gallbladder and required meticulous dissection for confirmation of this being the cystic duct going to the common duct. This was then divided and exposed in a blunt fashion. A critical view of the cystic duct and cystic artery was obtained.  The cystic duct was clearly identified and bluntly dissected. Using a cold wire the proximal cystic duct and artery were serially clipped leaving 2 clips on the proximal side they were then sharply cut with endoscopic shears. During the dissection of the duct and artery of the gallbladder anterior wall was torn with the laparoscopic grasper. This allowed visualization of a single large obstructing gallstone. The stone was removed from the gallbladder and placed over the liver during the remainder of the dissection.  In the process of dissecting out the critical structures the lateral and medial side walls were taken up the gallbladder using electrocautery. During this dissection laterally the liver was injured causing  venous blood loss.  This was controlled with pressure using a Ray-Tec gauze. Numerous attempts at obtaining hemostasis with electrocautery and clips to the visualized damage to vein were unsuccessful. Surgicel gauze was inserted into the abdomen placed under the Ray-Tec gauze and held under pressure until hemostasis was acquired.  The gallbladder was taken from the gallbladder fossa in a retrograde fashion with the electrocautery. The gallbladder was removed and placed in an Endocatch bag. The gallstone over the liver edge was then retrieved and also placed in the Endo Catch bag .The liver bed was irrigated and inspected. Hemostasis was achieved with the electrocautery. Copious irrigation was utilized and was repeatedly aspirated until clear.  The gallbladder and Endocatch sac were then removed through the epigastric port site. The port site had to be enlarged in order to remove the large stone that was present within the gallbladder.  The Ray-Tec gauze that had been placed in the abdomen for hemostasis were also removed after removal of the gallbladder.  Inspection of the right upper quadrant was performed. No bleeding, bile duct injury or leak, or bowel injury was noted. The previous site of bleeding lateral to the gallbladder fossa was again visualized and found to be hemostatic. A tongue of omentum was brought up and placed over the Surgicel gauze and left in place as the Pneumoperitoneum was released.  All the ports were removed under direct visualization. The epigastric port site was closed with figure-of-eight 0 Vicryl sutures. 4-0 subcuticular Monocryl was used to close the skin. Steristrips and Mastisol and sterile dressings were  applied.  The patient was then extubated and brought to the recovery room in stable condition. Sponge, lap, and needle counts were correct at closure and at the conclusion of the case.   Findings: Acute Cholecystitis with gallbladder hydrops  Estimated Blood Loss: 500 mL         Drains:  None         Specimens: Gallbladder           Complications: none               Braden Deloach T. Tonita Cong, MD, FACS

## 2015-03-15 NOTE — Telephone Encounter (Signed)
Called pt.  After results were received yesterday, I had taken her to the ER for acute cholecystectomy.  Pt says they performed surgery last night and is waiting to be discharged today.  States she is doing well. Told patient to let us know if we can do anything else for her, f/u prn

## 2015-03-15 NOTE — Progress Notes (Signed)
Pt Arrived to floor 0401am . Pt made familiar with floor and room environment. Understands and verbalized use of call bell and ascom phone. Introductory booklet  Given, pt understands fall scale and agreed to have moderate rating. Abdomen surgical site with midline dressing C/D/I. Pt with no c/o of pain at this time. Significant other at the bedside. Bed alarm placed due to nursing judgement and pt fresh post op. Will continue to monitor per unit protocol and M.D. Orders.

## 2015-03-15 NOTE — Transfer of Care (Signed)
Immediate Anesthesia Transfer of Care Note  Patient: Jasmine [name]  Procedure(s) Performed: Procedure(s): LAPAROSCOPIC CHOLECYSTECTOMY SINGLE SITE (N/A)  Patient Location: PACU  Anesthesia Type:General  Level of Consciousness: awake and patient cooperative  Airway & Oxygen Therapy: Patient Spontanous Breathing and Patient connected to nasal cannula oxygen  Post-op Assessment: Report given to RN and Post -op Vital signs reviewed and stable  Post vital signs: Reviewed and stable  Last Vitals:  Filed Vitals:   03/15/15 0305  BP: 126/80  Pulse: 86  Temp: 37.5 C  Resp: 18    Complications: No apparent anesthesia complications

## 2015-03-16 MED ORDER — MENTHOL 3 MG MT LOZG
1.0000 | LOZENGE | OROMUCOSAL | Status: DC | PRN
  Administered 2015-03-16: 3 mg via ORAL
  Filled 2015-03-16: qty 9

## 2015-03-16 MED ORDER — HYDROCODONE-ACETAMINOPHEN 5-325 MG PO TABS: 1.0000 | ORAL_TABLET | ORAL | Status: DC | PRN

## 2015-03-16 NOTE — Progress Notes (Signed)
Surgery progress note  S: Feels well, tolerating diet, pain controlled O:Blood pressure 145/80, pulse 85, temperature 98 F (36.7 C), temperature source Oral, resp. rate 16, height  (1.549 m), weight 172 lb (78.019 kg), SpO2 100 %. GEN: NAD/A&Ox3 ABD: soft, min tender, nondistended  A/P 54 yo s/p lap chole, doing well - possible d/c to home later

## 2015-03-16 NOTE — Discharge Instructions (Signed)
Do not drive on pain medications Do not lift greater than 15 lbs for a period of 6 weeks Call or return to ER if you develop fever greater than 101.5, nausea/vomiting, increased pain, redness/drainage from incisions  Cholecystostomy, Care After Refer to this sheet in the next few weeks. These instructions provide you with information about caring for yourself after your procedure. Your health care provider may also give you more specific instructions. Your treatment has been planned according to current medical practices, but problems sometimes occur. Call your health care provider if you have any problems or questions after your procedure. WHAT TO EXPECT AFTER THE PROCEDURE After your procedure, it is common to have soreness near the site of your drainage tube (catheter) or your incision. HOME CARE INSTRUCTIONS Incision Care  Follow instructions from your health care provider about how to take care of your incision. Make sure you:  Wash your hands with soap and water before you change your bandage (dressing). If soap and water are not available, use hand sanitizer.  Change your dressing as told by your health care provider.  Leave stitches (sutures), skin glue, or adhesive strips in place. These skin closures may need to be in place for 2 weeks or longer. If adhesive strip edges start to loosen and curl up, you may trim the loose edges. Do not remove adhesive strips completely unless your health care provider tells you to do that.  Check your incision and your drainage site every day for signs of infection. Watch for:  Redness, swelling, or pain.  Fluid, blood, or pus. General Instructions  If you were sent home with a surgical drain in place, follow instructions from your health care provider about how to care for your drain and collection bag at home.  Do not take baths, swim, or use a hot tub until your health care provider approves. Ask your health care provider if you can take  showers. You may only be allowed to take sponge baths for bathing.  Follow instructions from your health care provider about what you may eat or drink.  Take over-the-counter and prescription medicines only as told by your health care provider.  Keep all follow-up visits as told by your health care provider. This is important. SEEK MEDICAL CARE IF:  You have redness, swelling, or pain at your incision or drainage site.  You have nausea or vomiting. SEEK IMMEDIATE MEDICAL CARE IF:  Your abdominal pain gets worse.  You feel dizzy or you faint while standing.  You have fluid, blood, or pus coming from your incision or drainage site.  You have a fever.  You have shortness of breath.  You have a rapid heartbeat.  Your nausea or vomiting does not go away.  Your drainage tube becomes blocked.  Your drainage tube comes out of your abdomen.   This information is not intended to replace advice given to you by your health care provider. Make sure you discuss any questions you have with your health care provider.   Document Released: 02/13/2015 Document Reviewed: 09/05/2014 Elsevier Interactive Patient Education [employer]

## 2015-03-18 LAB — SURGICAL PATHOLOGY

## 2015-03-20 NOTE — Discharge Summary (Signed)
Patient ID: Jasmine BloodgoodGlenda Eve [name] MRN: [id number] DOB/AGE: 09-27-1960 54 y.o.  Admit date: 03/14/2015 Discharge date: 03/16/2015  Discharge Diagnoses:  Cholecystitis  Procedures Performed: Laparoscopic cholecystectomygood  Discharged Condition: good  Hospital Course: Taken to the operating room from the emergency department for a laparoscopic cholecystectomy. Was found to have cholecystitis with significant blood loss during the operation. Had serial blood counts performed which showed no evidence of continued bleeding. Tolerated a regular diet and oral pain medications able be discharged home.  Discharge Orders:  discharge home.  Disposition: 01-Home or Self Care  Discharge Medications: No current facility-administered medications for this encounter.  Current outpatient prescriptions:  .  ibuprofen (ADVIL,MOTRIN) 200 MG tablet, Take 400 mg by mouth every 6 (six) hours as needed for mild pain or moderate pain. , Disp: , Rfl:  .  HYDROcodone-acetaminophen (NORCO/VICODIN) 5-325 MG tablet, Take 1-2 tablets by mouth every 4 (four) hours as needed for moderate pain., Disp: 30 tablet, Rfl: 0 .  ketorolac (TORADOL) 10 MG tablet, Take 1 tablet (10 mg total) by mouth every 6 (six) hours as needed. (Patient not taking: Reported on 03/14/2015), Disp: 20 tablet, Rfl: 0  Follwup: Follow-up Information    Follow up with Ida Roguehristopher Lundquist, MD. Schedule an appointment as soon as possible for a visit in 1 week.   Specialty:  Surgery   Contact information:   [address] [place] [id number] [phone]       Signed: Ricarda FrameCharles Jeral Zick 03/20/2015, 3:19 PM

## 2015-03-21 ENCOUNTER — Ambulatory Visit (INDEPENDENT_AMBULATORY_CARE_PROVIDER_SITE_OTHER): Payer: PRIVATE HEALTH INSURANCE | Admitting: General Surgery

## 2015-03-21 ENCOUNTER — Encounter: Payer: Self-pay | Admitting: *Deleted

## 2015-03-21 VITALS — BP 127/73 | HR 76 | Temp 97.9°F | Ht 61.0 in | Wt 171.0 lb

## 2015-03-21 DIAGNOSIS — Z4889 Encounter for other specified surgical aftercare: Secondary | ICD-10-CM

## 2015-03-21 NOTE — Progress Notes (Signed)
Outpatient Surgical Follow Up  03/21/2015  Jasmine [name] is an 54 y.o. female.   Chief Complaint  Patient presents with  . Routine Post Op    HPI: 54 year old female follows up in clinic 1 week status post laparoscopic cholecystectomy for acute cholecystitis. Patient reports feeling well. Denies any abdominal pain. His only taking Tylenol for pain medicine. She has been eating well and having normal bowel function. Denies any fevers, chills, nausea, vomiting, diarrhea, constipation. She does report some soreness with movement and that she had difficulty getting in and out of her very tall bed and has been sleeping in a recliner some nights. This has been improving and she is very happy with her surgical results.  Past Medical History  Diagnosis Date  . Cholelithiases   . Migraine headache   . Hyperlipemia     Past Surgical History  Procedure Laterality Date  . Breast biopsy Left     neg  . Laparoscopic cholecystectomy single port N/A 03/14/2015    Procedure: LAPAROSCOPIC CHOLECYSTECTOMY SINGLE SITE;  Surgeon: Jasmine Frameharles Starletta Houchin, MD;  Location: [facility] ORS;  Service: General;  Laterality: N/A;  . Cesarean section    . Ectopic pregnancy surgery      Family History  Problem Relation Age of Onset  . Cholelithiasis Mother   . Cholelithiasis Father   . Hypertension Father   . Hyperlipidemia Father   . Cholelithiasis Brother     Social History:  reports that she has never smoked. She has never used smokeless tobacco. She reports that she does not drink alcohol or use illicit drugs.  Allergies:  Allergies  Allergen Reactions  . Codeine Nausea Only    Family history of allergy.  . Iodinated Diagnostic Agents Itching  . Shellfish Allergy Itching    Medications reviewed.    ROS  A multipoint review of systems was completed. All pertinent positives negatives within the history of present illness the remainder negative.  BP 127/73 mmHg  Pulse 76  Temp(Src) 97.9 F (36.6  C) (Oral)  Ht 5\' 1"  (1.549 m)  Wt 77.565 kg (171 lb)  BMI 32.33 kg/m2  Physical Exam Gen.: No acute distress  chest: Clear to all sedation Heart: Regular rate and rhythm Abdomen: Soft, nontender, nondistended. Well approximated laparoscopic cholecystomy sites with some ecchymosis. No evidence of infection. No erythema no drainage.   Pathology consistent with acute on chronic cholecystitis. No results found for this or any previous visit (from the past 48 hour(s)). No results found.  Assessment/Plan:  1. Aftercare following surgery 54 year old female one week status post lap scopic cholecystectomy. Counseled as to signs and symptoms of infection and herniation. Patient understands to return to clinic medially should they occur. Otherwise patient and follow-up on a when necessary basis.     Jasmine [name]  03/21/2015,negative

## 2015-03-21 NOTE — Patient Instructions (Signed)
Follow up in our office as needed. 

## 2015-05-23 ENCOUNTER — Telehealth: Payer: Self-pay | Admitting: General Surgery

## 2015-05-23 NOTE — Telephone Encounter (Signed)
Return phone call to patient at this time. No answer. Left voicemail for return phone call.

## 2015-05-23 NOTE — Telephone Encounter (Signed)
Patient had a gallbladder SX  10/6 with Dr.Woodham, per patient she has [facility] said maybe a her[facility]? She wanted to talk to a nurse about it. Per patient it is not painful or anything

## 2015-05-23 NOTE — Telephone Encounter (Signed)
Spoke with patient at this time. Patient has slight outpouching at top larger incision site. States that this is not bothering her at all, is not firm, and she is not having any GI symptoms. Explained to patient that we could have her seen by Dr. Tonita CongWoodham or she can call us if this begins to bother her.   She would like to wait to see if this is going to bother her at all and will call if this is the case.

## 2015-07-04 ENCOUNTER — Ambulatory Visit (INDEPENDENT_AMBULATORY_CARE_PROVIDER_SITE_OTHER): Payer: 59 | Admitting: General Surgery

## 2015-07-04 ENCOUNTER — Encounter: Payer: Self-pay | Admitting: General Surgery

## 2015-07-04 VITALS — BP 139/80 | HR 72 | Temp 98.3°F | Ht 61.0 in | Wt 175.0 lb

## 2015-07-04 DIAGNOSIS — K432 Incisional hernia without obstruction or gangrene: Secondary | ICD-10-CM | POA: Diagnosis not present

## 2015-07-04 NOTE — Progress Notes (Signed)
Outpatient Surgical Follow Up  07/04/2015  Jasmine [name] is an 55 y.o. female.   No chief complaint on file.   HPI:  55 year old female presents to clinic for evaluation of a new bulge from her upper midline incision from her laparoscopic cholecystectomy. Patient states that for about the past month she has noticed the area will bulge at the end of the day. It does not cause her any pain. It is always able to go immediately back in. It has not changed in size since she first noticed it. She denies any fevers, chills, nausea, vomiting, diarrhea, constipation, chest pain, shortness of breath.  Past Medical History  Diagnosis Date  . Cholelithiases   . Migraine headache   . Hyperlipemia     Past Surgical History  Procedure Laterality Date  . Breast biopsy Left     neg  . Laparoscopic cholecystectomy single port N/A 03/14/2015    Procedure: LAPAROSCOPIC CHOLECYSTECTOMY SINGLE SITE;  Surgeon: Ricarda Frame, MD;  Location: [facility] ORS;  Service: General;  Laterality: N/A;  . Cesarean section    . Ectopic pregnancy surgery      Family History  Problem Relation Age of Onset  . Cholelithiasis Mother   . Cholelithiasis Father   . Hypertension Father   . Hyperlipidemia Father   . Cholelithiasis Brother     Social History:  reports that she has never smoked. She has never used smokeless tobacco. She reports that she does not drink alcohol or use illicit drugs.  Allergies:  Allergies  Allergen Reactions  . Codeine Nausea Only    Family history of allergy.  . Iodinated Diagnostic Agents Itching  . Shellfish Allergy Itching    Medications reviewed.    ROS  multipoint review of systems was completed, all pertinent positives and negatives were documented in the history of present illness remainder negative.   BP 139/80 mmHg  Pulse 72  Temp(Src) 98.3 F (36.8 C) (Oral)  Ht  (1.549 m)  Wt 79.379 kg (175 lb)  BMI 33.08 kg/m2  Physical Exam  Gen.: No acute distress   Neck: Supple and nontender Lymph nodes: No evidence of cervical, clavicular, axillary lymphadenopathy Chest: Clear to auscultation Heart: Regular rate and rhythm Abdomen: Soft, nontender, nondistended. Well-healed laparoscopic cholecystectomy incision sites. There is an easily reducible, nontender bulge in her upper midline incision site.    No results found for this or any previous visit (from the past 48 hour(s)). No results found.  Assessment/Plan:  1. Incisional hernia, without obstruction or gangrene  55 year old female with a port site hernia from her upper midline port site. It is currently asymptomatic other than a visible bulge. Discussed the signs and symptoms of incarceration or strangulation and to seek medical attention immediately for them to be evaluated and fixed. Otherwise, stated that should the area however, symptomatic to return to clinic to schedule an elective repair. She voiced understanding we'll follow-up in clinic on an as-needed basis.     Ricarda Frame, MD FACS General Surgeon  07/04/2015,11:36 AM

## 2015-07-04 NOTE — Patient Instructions (Signed)
At this time you are okay to continue with your regular activities. However, if you notice that the hernia doesn't go back in, please give [place] a call.

## 2015-12-30 ENCOUNTER — Other Ambulatory Visit: Payer: Self-pay | Admitting: Internal Medicine

## 2015-12-30 DIAGNOSIS — Z1231 Encounter for screening mammogram for malignant neoplasm of breast: Secondary | ICD-10-CM

## 2016-01-08 ENCOUNTER — Encounter (INDEPENDENT_AMBULATORY_CARE_PROVIDER_SITE_OTHER): Payer: Self-pay

## 2016-01-08 ENCOUNTER — Other Ambulatory Visit: Payer: Self-pay

## 2016-01-08 DIAGNOSIS — Z299 Encounter for prophylactic measures, unspecified: Secondary | ICD-10-CM

## 2016-01-08 NOTE — Progress Notes (Signed)
Patient came in to have blood drawn for testing per Dr. Loraine Leriche Millers's orders.

## 2016-01-09 LAB — CMP12+LP+TP+TSH+6AC+CBC/D/PLT
ALT: 27 IU/L (ref 0–32)
AST: 22 IU/L (ref 0–40)
Albumin/Globulin Ratio: 1.3 (ref 1.2–2.2)
Albumin: 4.4 g/dL (ref 3.5–5.5)
Alkaline Phosphatase: 104 IU/L (ref 39–117)
BUN/Creatinine Ratio: 20 (ref 9–23)
BUN: 14 mg/dL (ref 6–24)
Basophils Absolute: 0 10*3/uL (ref 0.0–0.2)
Basos: 0 %
Bilirubin Total: 0.4 mg/dL (ref 0.0–1.2)
Calcium: 9.6 mg/dL (ref 8.7–10.2)
Chloride: 100 mmol/L (ref 96–106)
Chol/HDL Ratio: 5.7 ratio units — ABNORMAL HIGH (ref 0.0–4.4)
Cholesterol, Total: 251 mg/dL — ABNORMAL HIGH (ref 100–199)
Creatinine, Ser: 0.7 mg/dL (ref 0.57–1.00)
EOS (ABSOLUTE): 0.1 10*3/uL (ref 0.0–0.4)
Eos: 1 %
Estimated CHD Risk: 1.6 times avg. — ABNORMAL HIGH (ref 0.0–1.0)
Free Thyroxine Index: 2 (ref 1.2–4.9)
GFR calc Af Amer: 114 mL/min/{1.73_m2} (ref >59)
GFR calc non Af Amer: 99 mL/min/{1.73_m2} (ref >59)
GGT: 36 IU/L (ref 0–60)
Globulin, Total: 3.3 g/dL (ref 1.5–4.5)
Glucose: 91 mg/dL (ref 65–99)
HDL: 44 mg/dL (ref >39)
Hematocrit: 40.3 % (ref 34.0–46.6)
Hemoglobin: 13 g/dL (ref 11.1–15.9)
Immature Grans (Abs): 0 10*3/uL (ref 0.0–0.1)
Immature Granulocytes: 0 %
Iron: 63 ug/dL (ref 27–159)
LDH: 162 IU/L (ref 119–226)
LDL Calculated: 173 mg/dL — ABNORMAL HIGH (ref 0–99)
Lymphocytes Absolute: 3.1 10*3/uL (ref 0.7–3.1)
Lymphs: 32 %
MCH: 29.8 pg (ref 26.6–33.0)
MCHC: 32.3 g/dL (ref 31.5–35.7)
MCV: 92 fL (ref 79–97)
Monocytes Absolute: 0.7 10*3/uL (ref 0.1–0.9)
Monocytes: 8 %
Neutrophils Absolute: 5.6 10*3/uL (ref 1.4–7.0)
Neutrophils: 59 %
Phosphorus: 4.1 mg/dL (ref 2.5–4.5)
Platelets: 328 10*3/uL (ref 150–379)
Potassium: 4.5 mmol/L (ref 3.5–5.2)
RBC: 4.36 x10E6/uL (ref 3.77–5.28)
RDW: 14.8 % (ref 12.3–15.4)
Sodium: 140 mmol/L (ref 134–144)
T3 Uptake Ratio: 22 % — ABNORMAL LOW (ref 24–39)
T4, Total: 8.9 ug/dL (ref 4.5–12.0)
TSH: 2.69 u[IU]/mL (ref 0.450–4.500)
Total Protein: 7.7 g/dL (ref 6.0–8.5)
Triglycerides: 170 mg/dL — ABNORMAL HIGH (ref 0–149)
Uric Acid: 6 mg/dL (ref 2.5–7.1)
VLDL Cholesterol Cal: 34 mg/dL (ref 5–40)
WBC: 9.6 10*3/uL (ref 3.4–10.8)

## 2016-01-09 LAB — URINALYSIS, COMPLETE
Bilirubin, UA: NEGATIVE
Glucose, UA: NEGATIVE
Ketones, UA: NEGATIVE
Nitrite, UA: NEGATIVE
Protein, UA: NEGATIVE
RBC, UA: NEGATIVE
Specific Gravity, UA: 1.012 (ref 1.005–1.030)
Urobilinogen, Ur: 0.2 mg/dL (ref 0.2–1.0)
pH, UA: 6 (ref 5.0–7.5)

## 2016-01-09 LAB — MICROSCOPIC EXAMINATION
Bacteria, UA: NONE SEEN
Casts: NONE SEEN /lpf

## 2016-01-09 LAB — HGB A1C W/O EAG: HEMOGLOBIN A1C: 6.1 % — AB (ref 4.8–5.6)

## 2016-02-05 ENCOUNTER — Other Ambulatory Visit: Payer: Self-pay | Admitting: Internal Medicine

## 2016-02-05 ENCOUNTER — Ambulatory Visit
Admission: RE | Admit: 2016-02-05 | Discharge: 2016-02-05 | Disposition: A | Discharge status: Home / Self Care | Payer: Managed Care, Other (non HMO) | Source: Ambulatory Visit | Attending: Internal Medicine | Admitting: Internal Medicine

## 2016-02-05 DIAGNOSIS — Z1231 Encounter for screening mammogram for malignant neoplasm of breast: Secondary | ICD-10-CM

## 2016-04-23 ENCOUNTER — Encounter: Payer: Self-pay | Admitting: Physician Assistant

## 2016-04-23 ENCOUNTER — Ambulatory Visit: Payer: Self-pay | Admitting: Physician Assistant

## 2016-04-23 VITALS — BP 130/90 | HR 80 | Temp 98.1°F

## 2016-04-23 DIAGNOSIS — J0100 Acute maxillary sinusitis, unspecified: Secondary | ICD-10-CM

## 2016-04-23 MED ORDER — AZITHROMYCIN 250 MG PO TABS: ORAL_TABLET | ORAL | 0 refills | Status: AC

## 2016-04-23 NOTE — Progress Notes (Signed)
S: C/o runny nose and congestion for 4 weeks,  no fever, chills, cp/sob, v/d; mucus is green and thick, cough is sporadic, c/o of facial and dental pain.   Using otc meds: ?flonase  O: PE: vitals wnl, nad, perrl eomi, normocephalic, tms dull, nasal mucosa red and swollen on r side, boggy on left, throat injected, neck supple no lymph, lungs c t a, cv rrr, neuro intact  A:  Acute sinusitis   P: drink fluids, continue regular meds , use otc meds of choice, return if not improving in 5 days, return earlier if worsening , zpack, otc mucinex, recheck bp when feeling better as is borderline today

## 2016-07-31 ENCOUNTER — Other Ambulatory Visit: Payer: Self-pay

## 2016-07-31 NOTE — Progress Notes (Signed)
Patient sent me her biometric form to be filled out by Darl PikesSusan.

## 2016-12-28 ENCOUNTER — Other Ambulatory Visit: Payer: Self-pay | Admitting: Internal Medicine

## 2016-12-28 DIAGNOSIS — Z1231 Encounter for screening mammogram for malignant neoplasm of breast: Secondary | ICD-10-CM

## 2017-02-05 ENCOUNTER — Ambulatory Visit
Admission: RE | Admit: 2017-02-05 | Discharge: 2017-02-05 | Disposition: A | Discharge status: Home / Self Care | Payer: Managed Care, Other (non HMO) | Source: Ambulatory Visit | Attending: Internal Medicine | Admitting: Internal Medicine

## 2017-02-05 DIAGNOSIS — Z1231 Encounter for screening mammogram for malignant neoplasm of breast: Secondary | ICD-10-CM

## 2017-03-22 ENCOUNTER — Other Ambulatory Visit: Payer: Self-pay

## 2017-03-22 DIAGNOSIS — Z299 Encounter for prophylactic measures, unspecified: Secondary | ICD-10-CM

## 2017-03-22 NOTE — Progress Notes (Signed)
Patient came in to have blood drawn for testing per Dr. Mark Miller's orders. 

## 2017-03-23 LAB — CMP12+LP+TP+TSH+6AC+CBC/D/PLT
A/G RATIO: 1.5 (ref 1.2–2.2)
ALBUMIN: 4.4 g/dL (ref 3.5–5.5)
ALK PHOS: 85 IU/L (ref 39–117)
ALT: 18 IU/L (ref 0–32)
AST: 16 IU/L (ref 0–40)
BASOS ABS: 0 10*3/uL (ref 0.0–0.2)
BASOS: 1 %
BILIRUBIN TOTAL: 0.5 mg/dL (ref 0.0–1.2)
BUN / CREAT RATIO: 13 (ref 9–23)
BUN: 10 mg/dL (ref 6–24)
CHLORIDE: 101 mmol/L (ref 96–106)
CHOLESTEROL TOTAL: 227 mg/dL — AB (ref 100–199)
Calcium: 9.7 mg/dL (ref 8.7–10.2)
Chol/HDL Ratio: 5.5 ratio — ABNORMAL HIGH (ref 0.0–4.4)
Creatinine, Ser: 0.75 mg/dL (ref 0.57–1.00)
EOS (ABSOLUTE): 0.1 10*3/uL (ref 0.0–0.4)
EOS: 1 %
Estimated CHD Risk: 1.5 times avg. — ABNORMAL HIGH (ref 0.0–1.0)
FREE THYROXINE INDEX: 2 (ref 1.2–4.9)
GFR calc non Af Amer: 90 mL/min/{1.73_m2} (ref >59)
GFR, EST AFRICAN AMERICAN: 104 mL/min/{1.73_m2} (ref >59)
GGT: 22 IU/L (ref 0–60)
GLUCOSE: 95 mg/dL (ref 65–99)
Globulin, Total: 2.9 g/dL (ref 1.5–4.5)
HDL: 41 mg/dL (ref >39)
HEMATOCRIT: 38.6 % (ref 34.0–46.6)
HEMOGLOBIN: 12.7 g/dL (ref 11.1–15.9)
IMMATURE GRANS (ABS): 0 10*3/uL (ref 0.0–0.1)
IMMATURE GRANULOCYTES: 0 %
IRON: 69 ug/dL (ref 27–159)
LDH: 167 IU/L (ref 119–226)
LDL CALC: 140 mg/dL — AB (ref 0–99)
LYMPHS ABS: 3.2 10*3/uL — AB (ref 0.7–3.1)
LYMPHS: 36 %
MCH: 30 pg (ref 26.6–33.0)
MCHC: 32.9 g/dL (ref 31.5–35.7)
MCV: 91 fL (ref 79–97)
MONOCYTES: 7 %
Monocytes Absolute: 0.6 10*3/uL (ref 0.1–0.9)
NEUTROS PCT: 55 %
Neutrophils Absolute: 4.8 10*3/uL (ref 1.4–7.0)
PLATELETS: 314 10*3/uL (ref 150–379)
Phosphorus: 3.5 mg/dL (ref 2.5–4.5)
Potassium: 3.8 mmol/L (ref 3.5–5.2)
RBC: 4.23 x10E6/uL (ref 3.77–5.28)
RDW: 14.3 % (ref 12.3–15.4)
Sodium: 140 mmol/L (ref 134–144)
T3 UPTAKE RATIO: 23 % — AB (ref 24–39)
T4, Total: 8.6 ug/dL (ref 4.5–12.0)
TOTAL PROTEIN: 7.3 g/dL (ref 6.0–8.5)
TSH: 2.97 u[IU]/mL (ref 0.450–4.500)
Triglycerides: 228 mg/dL — ABNORMAL HIGH (ref 0–149)
URIC ACID: 6.2 mg/dL (ref 2.5–7.1)
VLDL CHOLESTEROL CAL: 46 mg/dL — AB (ref 5–40)
WBC: 8.7 10*3/uL (ref 3.4–10.8)

## 2017-03-23 LAB — URINALYSIS, ROUTINE W REFLEX MICROSCOPIC
Bilirubin, UA: NEGATIVE
GLUCOSE, UA: NEGATIVE
KETONES UA: NEGATIVE
Nitrite, UA: NEGATIVE
PROTEIN UA: NEGATIVE
RBC, UA: NEGATIVE
SPEC GRAV UA: 1.012 (ref 1.005–1.030)
Urobilinogen, Ur: 0.2 mg/dL (ref 0.2–1.0)
pH, UA: 5.5 (ref 5.0–7.5)

## 2017-03-23 LAB — MICROSCOPIC EXAMINATION: Casts: NONE SEEN /lpf

## 2017-03-23 LAB — VITAMIN B12: VITAMIN B 12: 335 pg/mL (ref 232–1245)

## 2017-12-24 ENCOUNTER — Other Ambulatory Visit: Payer: Self-pay | Admitting: Internal Medicine

## 2017-12-24 DIAGNOSIS — Z1231 Encounter for screening mammogram for malignant neoplasm of breast: Secondary | ICD-10-CM

## 2018-02-08 ENCOUNTER — Ambulatory Visit
Admission: RE | Admit: 2018-02-08 | Discharge: 2018-02-08 | Disposition: A | Discharge status: Home / Self Care | Payer: Managed Care, Other (non HMO) | Source: Ambulatory Visit | Attending: Internal Medicine | Admitting: Internal Medicine

## 2018-02-08 DIAGNOSIS — Z1231 Encounter for screening mammogram for malignant neoplasm of breast: Secondary | ICD-10-CM | POA: Diagnosis present

## 2018-12-27 ENCOUNTER — Other Ambulatory Visit: Payer: Self-pay | Admitting: Internal Medicine

## 2018-12-27 DIAGNOSIS — Z1231 Encounter for screening mammogram for malignant neoplasm of breast: Secondary | ICD-10-CM

## 2019-04-12 ENCOUNTER — Other Ambulatory Visit: Payer: Self-pay

## 2019-04-12 ENCOUNTER — Ambulatory Visit
Admission: RE | Admit: 2019-04-12 | Discharge: 2019-04-12 | Disposition: A | Discharge status: Home / Self Care | Payer: Managed Care, Other (non HMO) | Source: Ambulatory Visit | Attending: Internal Medicine | Admitting: Internal Medicine

## 2019-04-12 DIAGNOSIS — Z1231 Encounter for screening mammogram for malignant neoplasm of breast: Secondary | ICD-10-CM | POA: Diagnosis not present

## 2020-02-27 ENCOUNTER — Other Ambulatory Visit: Payer: Self-pay | Admitting: Internal Medicine

## 2020-02-27 DIAGNOSIS — Z1231 Encounter for screening mammogram for malignant neoplasm of breast: Secondary | ICD-10-CM

## 2020-04-16 ENCOUNTER — Other Ambulatory Visit: Payer: Self-pay

## 2020-04-16 ENCOUNTER — Ambulatory Visit
Admission: RE | Admit: 2020-04-16 | Discharge: 2020-04-16 | Disposition: A | Discharge status: Home / Self Care | Payer: Managed Care, Other (non HMO) | Source: Ambulatory Visit | Attending: Internal Medicine | Admitting: Internal Medicine

## 2020-04-16 DIAGNOSIS — Z1231 Encounter for screening mammogram for malignant neoplasm of breast: Secondary | ICD-10-CM | POA: Diagnosis present

## 2021-03-10 ENCOUNTER — Other Ambulatory Visit: Payer: Self-pay | Admitting: Internal Medicine

## 2021-03-10 DIAGNOSIS — Z1231 Encounter for screening mammogram for malignant neoplasm of breast: Secondary | ICD-10-CM

## 2021-03-21 DIAGNOSIS — Z23 Encounter for immunization: Secondary | ICD-10-CM

## 2021-04-21 ENCOUNTER — Ambulatory Visit
Admission: RE | Admit: 2021-04-21 | Discharge: 2021-04-21 | Disposition: A | Discharge status: Home / Self Care | Payer: Managed Care, Other (non HMO) | Source: Ambulatory Visit | Attending: Internal Medicine | Admitting: Internal Medicine

## 2021-04-21 ENCOUNTER — Other Ambulatory Visit: Payer: Self-pay

## 2021-04-21 DIAGNOSIS — Z1231 Encounter for screening mammogram for malignant neoplasm of breast: Secondary | ICD-10-CM | POA: Diagnosis not present

## 2022-01-26 IMAGING — MG MM DIGITAL SCREENING BILAT W/ TOMO AND CAD
6 of 10 series · 6 of 30 positions shown · non-contrast
Comparison: Previous exam(s).

CLINICAL DATA: Screening.

EXAM:
DIGITAL SCREENING BILATERAL MAMMOGRAM WITH TOMOSYNTHESIS AND CAD
TECHNIQUE: Bilateral screening digital craniocaudal and mediolateral oblique
mammograms were obtained. Bilateral screening digital breast
tomosynthesis was performed. The images were evaluated with
computer-aided detection.

[R MLO synth-2D (1 of 2)]
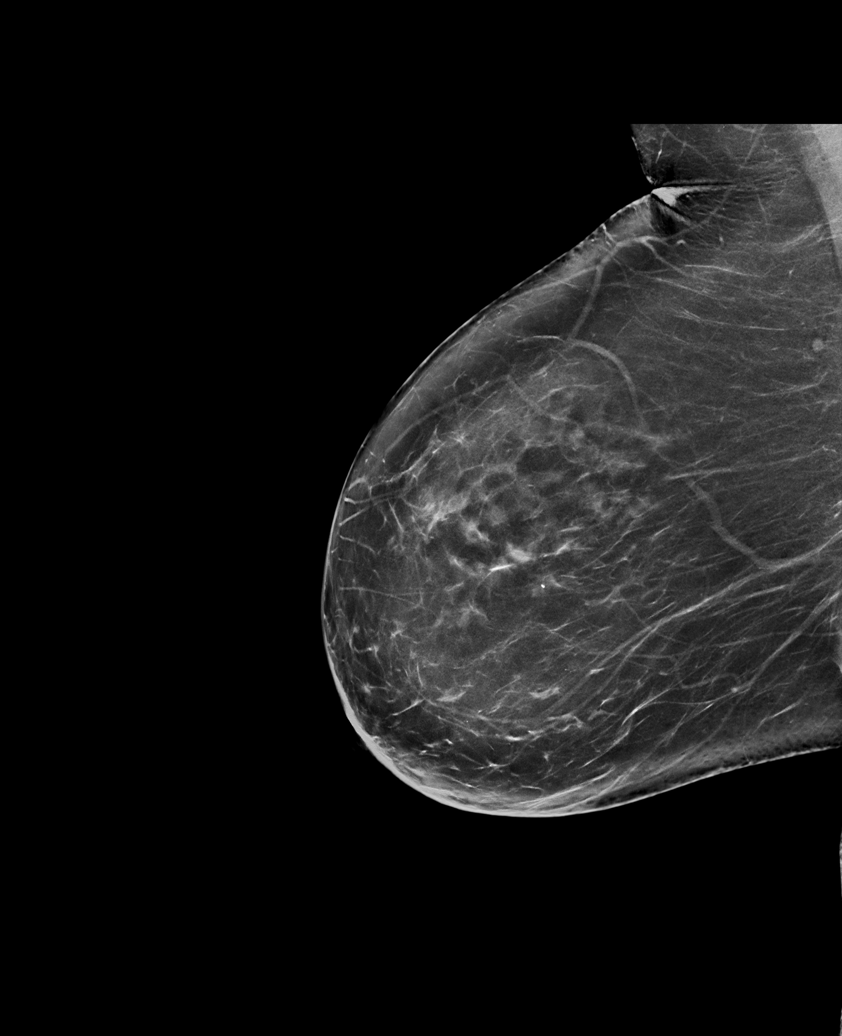

[R CC synth-2D]
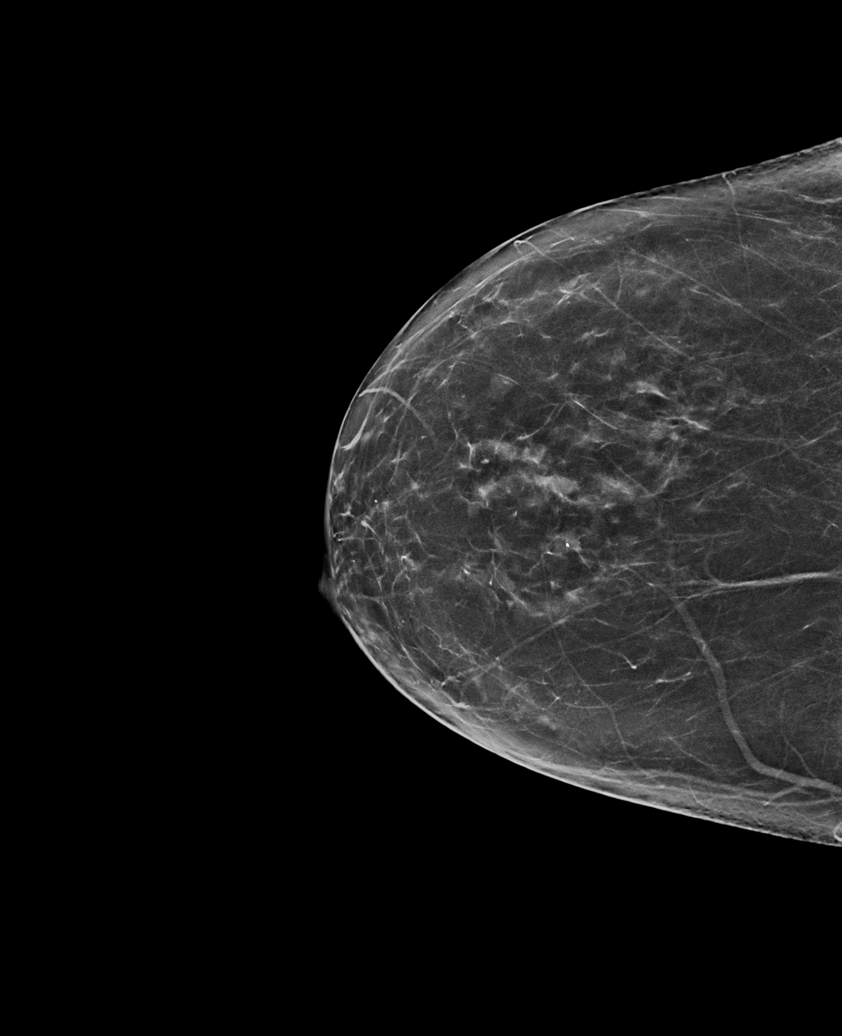

[L MLO synth-2D]
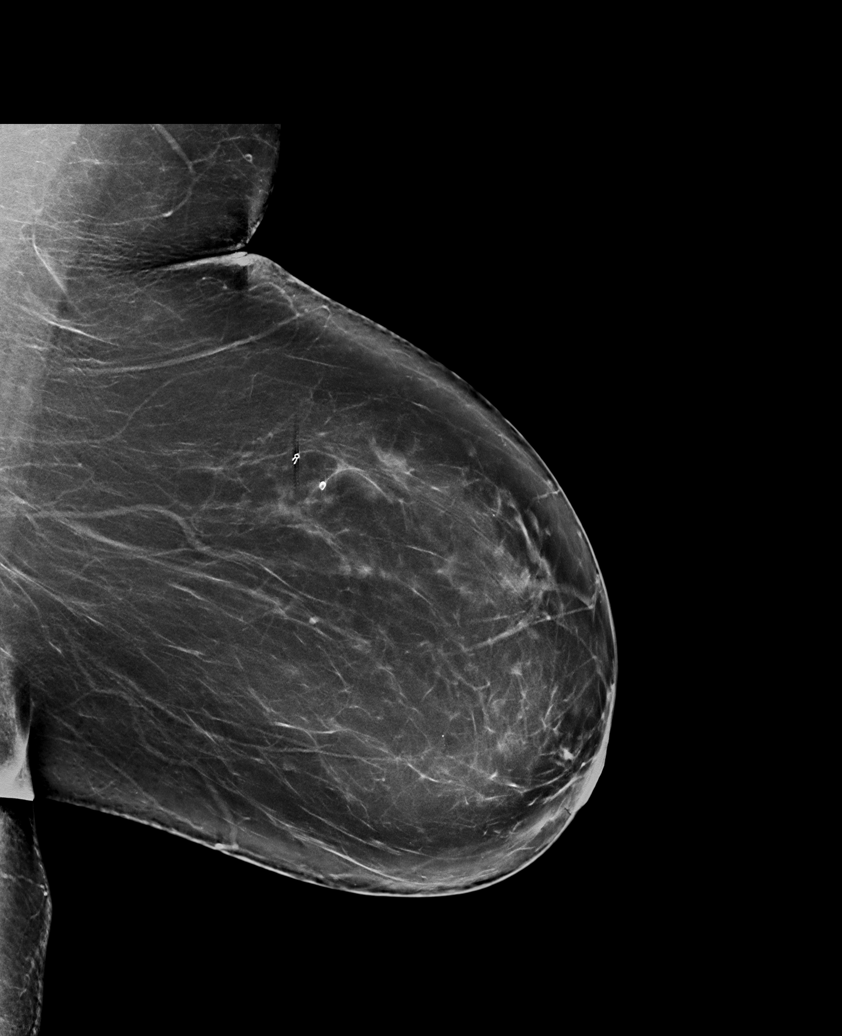

[R MLO synth-2D (2 of 2)]
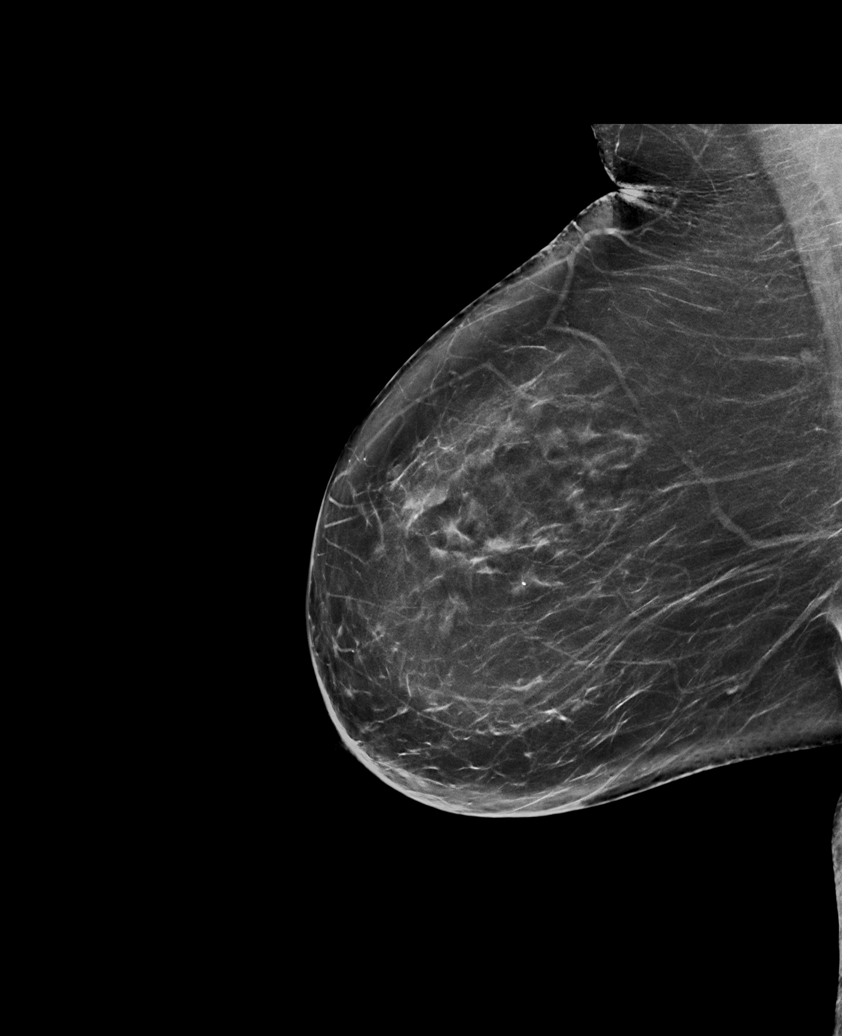

[L CC synth-2D]
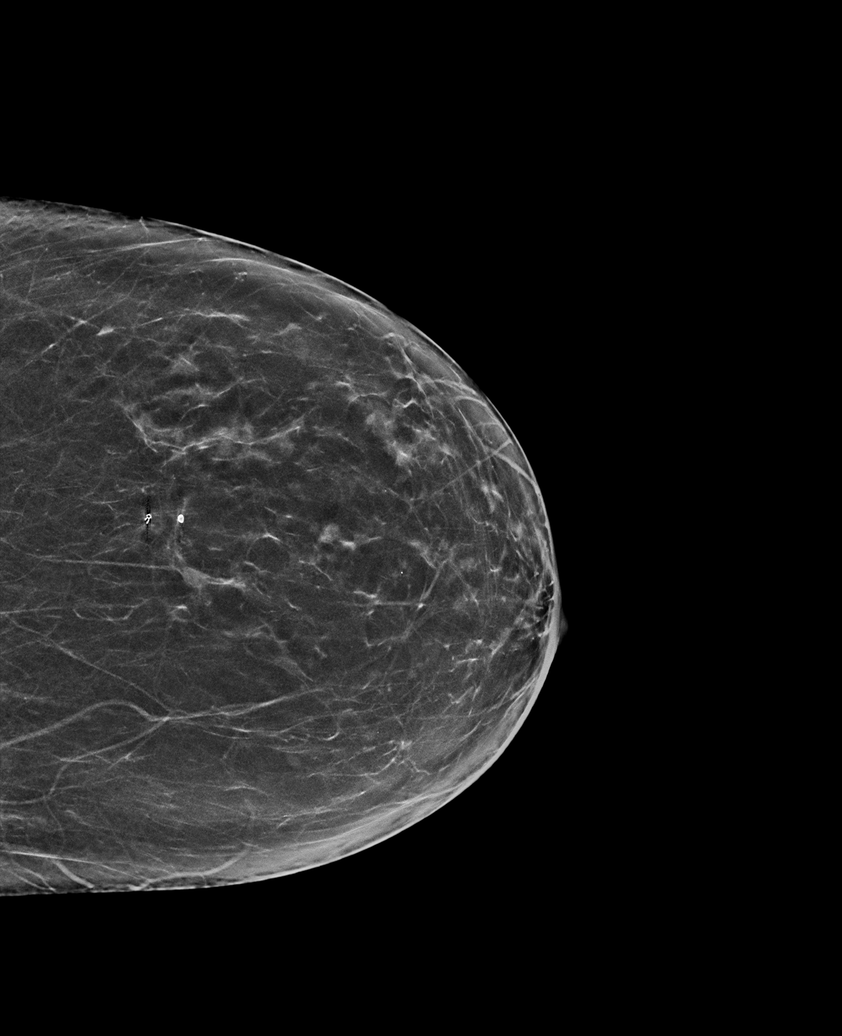

[R MLO tomo · tomo slice 39/76.0]
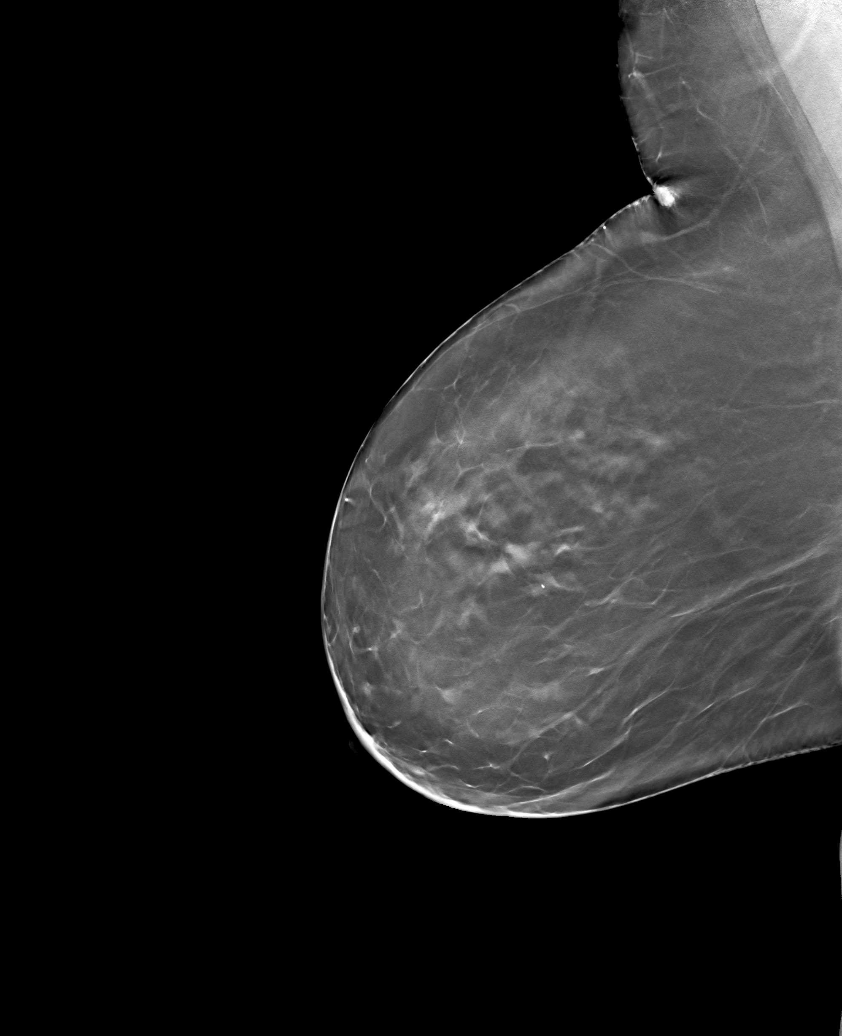

[6 of 30 positions shown; findings below may reference images not displayed]

ACR Breast Density Category b: There are scattered areas of
fibroglandular density.
FINDINGS: There are no findings suspicious for malignancy.
IMPRESSION: No mammographic evidence of malignancy. A result letter of this
screening mammogram will be mailed directly to the patient.

RECOMMENDATION:
Screening mammogram in one year. (Code:51-O-LD2)

BI-RADS CATEGORY  1: Negative.

## 2022-03-09 ENCOUNTER — Other Ambulatory Visit: Payer: Self-pay | Admitting: Internal Medicine

## 2022-03-09 DIAGNOSIS — Z1231 Encounter for screening mammogram for malignant neoplasm of breast: Secondary | ICD-10-CM

## 2022-03-18 ENCOUNTER — Ambulatory Visit: Payer: 59

## 2022-03-30 ENCOUNTER — Ambulatory Visit (LOCAL_COMMUNITY_HEALTH_CENTER): Payer: 59

## 2022-03-30 DIAGNOSIS — Z23 Encounter for immunization: Secondary | ICD-10-CM

## 2022-03-30 NOTE — Progress Notes (Signed)
Received flu shot R. Delt. Tolerated well. [facility] updated. Declined copy of [facility].

## 2022-04-24 ENCOUNTER — Ambulatory Visit
Admission: RE | Admit: 2022-04-24 | Discharge: 2022-04-24 | Disposition: A | Discharge status: Home / Self Care | Payer: Managed Care, Other (non HMO) | Source: Ambulatory Visit | Attending: Internal Medicine | Admitting: Internal Medicine

## 2022-04-24 DIAGNOSIS — Z1231 Encounter for screening mammogram for malignant neoplasm of breast: Secondary | ICD-10-CM | POA: Insufficient documentation

## 2022-08-06 ENCOUNTER — Ambulatory Visit (LOCAL_COMMUNITY_HEALTH_CENTER): Payer: 59

## 2022-08-06 ENCOUNTER — Ambulatory Visit: Payer: 59

## 2022-08-06 DIAGNOSIS — Z23 Encounter for immunization: Secondary | ICD-10-CM

## 2022-08-06 DIAGNOSIS — Z719 Counseling, unspecified: Secondary | ICD-10-CM

## 2022-08-06 NOTE — Progress Notes (Signed)
In nurse clinic and requested Shingles vaccine.  See immunization flow sheet.  VIS given and reviewed with pt.  Shingrix vaccine administered; tolerated well.  [facility] updated and copy to pt; informed when next vaccine due.  Tonny Branch, RN

## 2022-11-03 ENCOUNTER — Ambulatory Visit (LOCAL_COMMUNITY_HEALTH_CENTER): Payer: 59

## 2022-11-03 DIAGNOSIS — Z23 Encounter for immunization: Secondary | ICD-10-CM | POA: Diagnosis not present

## 2022-11-03 DIAGNOSIS — Z719 Counseling, unspecified: Secondary | ICD-10-CM

## 2022-11-03 NOTE — Progress Notes (Signed)
In nurse clinic for Shingrix #2.  Vaccine given and tolerated well. Updated [facility] copy given. Jerel Shepherd, RN

## 2023-02-18 ENCOUNTER — Ambulatory Visit: Payer: Managed Care, Other (non HMO)

## 2023-02-18 DIAGNOSIS — Z719 Counseling, unspecified: Secondary | ICD-10-CM

## 2023-02-18 DIAGNOSIS — Z23 Encounter for immunization: Secondary | ICD-10-CM | POA: Diagnosis not present

## 2023-02-18 NOTE — Progress Notes (Signed)
In nurse clinic requested Moderna vaccine. Eligible per [facility]. Administered SpikeVax 12y+, yr 2024-2025, tolerated well. Given VIS and [facility] copy, explained and understood. M.Aqsa Sensabaugh, LPN.

## 2023-03-08 ENCOUNTER — Ambulatory Visit: Payer: Managed Care, Other (non HMO)

## 2023-03-08 DIAGNOSIS — K621 Rectal polyp: Secondary | ICD-10-CM | POA: Diagnosis not present

## 2023-03-08 DIAGNOSIS — K5730 Diverticulosis of large intestine without perforation or abscess without bleeding: Secondary | ICD-10-CM | POA: Diagnosis not present

## 2023-03-08 DIAGNOSIS — K635 Polyp of colon: Secondary | ICD-10-CM | POA: Diagnosis not present

## 2023-03-08 DIAGNOSIS — Z1211 Encounter for screening for malignant neoplasm of colon: Secondary | ICD-10-CM | POA: Diagnosis present

## 2023-03-08 DIAGNOSIS — K640 First degree hemorrhoids: Secondary | ICD-10-CM | POA: Diagnosis not present

## 2023-03-11 ENCOUNTER — Other Ambulatory Visit: Payer: Self-pay | Admitting: Internal Medicine

## 2023-03-11 DIAGNOSIS — Z1231 Encounter for screening mammogram for malignant neoplasm of breast: Secondary | ICD-10-CM

## 2023-03-25 ENCOUNTER — Ambulatory Visit: Payer: Managed Care, Other (non HMO)

## 2023-03-25 DIAGNOSIS — Z719 Counseling, unspecified: Secondary | ICD-10-CM

## 2023-03-25 DIAGNOSIS — Z23 Encounter for immunization: Secondary | ICD-10-CM

## 2023-03-25 NOTE — Progress Notes (Signed)
In nurse clinic for immunizations. Patient requesting Flu vaccine. Voices no concerns. VIS reviewed and given to patient. Vaccine tolerated well; no issues noted. [facility] updated and copy given to patient.   Abagail Kitchens, RN

## 2023-04-30 ENCOUNTER — Ambulatory Visit
Admission: RE | Admit: 2023-04-30 | Discharge: 2023-04-30 | Disposition: A | Discharge status: Home / Self Care | Payer: Managed Care, Other (non HMO) | Source: Ambulatory Visit | Attending: Internal Medicine | Admitting: Internal Medicine

## 2023-04-30 DIAGNOSIS — Z1231 Encounter for screening mammogram for malignant neoplasm of breast: Secondary | ICD-10-CM | POA: Diagnosis present

## 2023-10-13 ENCOUNTER — Other Ambulatory Visit: Payer: Self-pay | Admitting: Internal Medicine

## 2023-10-13 DIAGNOSIS — E782 Mixed hyperlipidemia: Secondary | ICD-10-CM

## 2023-10-13 DIAGNOSIS — Z0000 Encounter for general adult medical examination without abnormal findings: Secondary | ICD-10-CM

## 2023-10-26 ENCOUNTER — Ambulatory Visit
Admission: RE | Admit: 2023-10-26 | Discharge: 2023-10-26 | Disposition: A | Discharge status: Home / Self Care | Payer: Self-pay | Source: Ambulatory Visit | Attending: Internal Medicine | Admitting: Internal Medicine

## 2023-10-26 DIAGNOSIS — Z0000 Encounter for general adult medical examination without abnormal findings: Secondary | ICD-10-CM | POA: Insufficient documentation

## 2023-10-26 DIAGNOSIS — E782 Mixed hyperlipidemia: Secondary | ICD-10-CM | POA: Insufficient documentation

## 2024-03-21 ENCOUNTER — Other Ambulatory Visit: Payer: Self-pay | Admitting: Internal Medicine

## 2024-03-21 DIAGNOSIS — Z1231 Encounter for screening mammogram for malignant neoplasm of breast: Secondary | ICD-10-CM

## 2024-05-02 ENCOUNTER — Ambulatory Visit
Admission: RE | Admit: 2024-05-02 | Discharge: 2024-05-02 | Disposition: A | Discharge status: Home / Self Care | Source: Ambulatory Visit | Attending: Internal Medicine | Admitting: Internal Medicine

## 2024-05-02 DIAGNOSIS — Z1231 Encounter for screening mammogram for malignant neoplasm of breast: Secondary | ICD-10-CM | POA: Diagnosis present
